# Patient Record
Sex: Male | Born: 1985 | Race: White | Hispanic: No | Smoking: Current every day smoker
Health system: Southern US, Community
[De-identification: ages and names within clinical notes are randomized; demographics above are authoritative.]

## PROBLEM LIST (undated history)

## (undated) DIAGNOSIS — M8700 Idiopathic aseptic necrosis of unspecified bone: Secondary | ICD-10-CM

## (undated) DIAGNOSIS — G4730 Sleep apnea, unspecified: Secondary | ICD-10-CM

## (undated) DIAGNOSIS — F909 Attention-deficit hyperactivity disorder, unspecified type: Secondary | ICD-10-CM

## (undated) DIAGNOSIS — J189 Pneumonia, unspecified organism: Secondary | ICD-10-CM

## (undated) DIAGNOSIS — Z8711 Personal history of peptic ulcer disease: Secondary | ICD-10-CM

## (undated) DIAGNOSIS — K219 Gastro-esophageal reflux disease without esophagitis: Secondary | ICD-10-CM

## (undated) HISTORY — PX: MOUTH SURGERY: SHX715

## (undated) HISTORY — PX: MENISCECTOMY: SHX123

---

## 2020-01-13 DIAGNOSIS — M8700 Idiopathic aseptic necrosis of unspecified bone: Secondary | ICD-10-CM

## 2020-01-23 NOTE — H&P (Addendum)
HIP ARTHROPLASTY ADMISSION H&P  Patient ID: [name] MRN: [id number] DOB/AGE: [birth year] 33 y.o.  Chief Complaint: left hip pain.  Planned Procedure Date: 02/17/2020 Medical Clearance by Mendel Ryder PA-C   HPI: Wayne Medina is a 34 y.o. 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 The patient has a history of pain and functional disability in the left hip and has failed non-surgical conservative treatments for greater than 12 weeks to include NSAID's and/or analgesics, corticosteriod injections, flexibility and strengthening excercises and activity modification.  Onset of symptoms was abrupt, starting 4 months ago with rapidlly worsening course since that time. The patient noted prior procedures on the knee to include  menisectomy on the left knee.  Patient currently rates pain at 7 out of 10 with activity. Patient has worsening of pain with activity and weight bearing, pain that interferes with activities of daily living, pain with passive range of motion, crepitus and joint swelling.  Patient has evidence of degeneration of the b/l hips by imaging studies.  There is no active infection.  PMH OSA- CPAP Noncompliant  Prior to Admission medications   Not on File   Social History   Socioeconomic History  . Marital status: Single    Spouse name: Not on file  . Number of children: Not on file  . Years of education: Not on file  . Highest education level: Not on file  Occupational History  . Not on file  Tobacco Use  . Smoking status: Not on file  Substance and Sexual Activity  . Alcohol use: Not on file  . Drug use: Not on file  . Sexual activity: Not on file  Other Topics Concern  . Not on file  Social History Narrative  . Not on file   Social Determinants of Health   Financial Resource Strain:   . Difficulty of Paying Living Expenses:   Food Insecurity:   . Worried About Programme researcher, broadcasting/film/video in the Last Year:   . Barista in the Last Year:    Transportation Needs:   . Freight forwarder (Medical):   Marland Kitchen Lack of Transportation (Non-Medical):   Physical Activity:   . Days of Exercise per Week:   . Minutes of Exercise per Session:   Stress:   . Feeling of Stress :   Social Connections:   . Frequency of Communication with Friends and Family:   . Frequency of Social Gatherings with Friends and Family:   . Attends Religious Services:   . Active Member of Clubs or Organizations:   . Attends Banker Meetings:   Marland Kitchen Marital Status:    No family history on file.  ROS: Currently denies lightheadedness, dizziness, Fever, chills, CP, SOB.   No personal history of DVT, PE, MI, or CVA. No loose teeth or dentures All other systems have been reviewed and were otherwise currently negative with the exception of those mentioned in the HPI and as above.  Objective: Vitals: Ht: 71"  Wt: 133.4 lbs BMI: 18.5 Temp: 97.9  BP: 123/79  Pulse: 79 O2 100% on room air.   Physical Exam: General: Alert, NAD. Trendelenberg Gait   HEENT: EOMI, Good Neck Extension, AT, [place] Pulm: No increased work of breathing.  Clear B/L A/P w/o crackle or wheeze.  CV: RRR, No m/g/r appreciated  GI: soft, NT, ND Neuro: Neuro without gross focal deficit.  Sensation intact distally Skin: No lesions in the area of chief complaint MSK/Surgical  Site: Left Hip pain with passive ROM.  Positive Stinchfield.  5/5 strength.  NVI.    Imaging Review Plain radiographs demonstrate moderate degenerative joint disease of the bilateral hip.   The bone quality appears to be poor for age and reported activity level.  Preoperative templating of the joint replacement has been completed, documented, and submitted to the Operating Room personnel in order to optimize intra-operative equipment management.  Assessment: AVN LEFT HIP Active Problems:   AVN of femur (HCC)   Plan: Plan for Procedure(s): TOTAL HIP ARTHROPLASTY ANTERIOR APPROACH  The patient history,  physical exam, clinical judgement of the provider and imaging are consistent with end stage degenerative joint disease and total joint arthroplasty is deemed medically necessary. The treatment options including medical management, injection therapy, and arthroplasty were discussed at length. The risks and benefits of Procedure(s): TOTAL HIP ARTHROPLASTY ANTERIOR APPROACH were presented and reviewed.  The risks of nonoperative treatment, versus surgical intervention including but not limited to continued pain, aseptic loosening, stiffness, dislocation/subluxation, infection, bleeding, nerve injury, blood clots, cardiopulmonary complications, morbidity, mortality, among others were discussed. The patient verbalizes understanding and wishes to proceed with the plan.  Patient is being admitted for surgery, pain control, PT, prophylactic antibiotics, VTE prophylaxis, progressive ambulation, ADL's and discharge planning.   Dental prophylaxis discussed and recommended for 2 years postoperatively.   The patient does  meet the criteria for TXA which will be used perioperatively.    ASA 81 mg BID will be used postoperatively for DVT prophylaxis in addition to SCDs, and early ambulation.  Plan for Norco, Celebrex, for pain.  Robaxin for muscle spasms, Zofran for nausea.  The patient is planning to be discharged home with OPPT.   Patient's anticipated LOS is less than 2 midnights, meeting these requirements: - Younger than 65 - Lives within 1 hour of care - Has a competent adult at home to recover with post-op recover - NO history of  - Chronic pain requiring opiods  - Diabetes  - Coronary Artery Disease  - Heart failure  - Heart attack  - Stroke  - DVT/VTE  - Cardiac arrhythmia  - Respiratory Failure/COPD  - Renal failure  - Anemia  - Advanced Liver disease       Rainey Pines, PA-C 01/23/2020 9:38 AM

## 2020-02-06 NOTE — Patient Instructions (Addendum)
DUE TO COVID-19 ONLY ONE VISITOR IS ALLOWED TO COME WITH YOU AND STAY IN THE WAITING ROOM ONLY DURING PRE OP AND PROCEDURE.    COVID SWAB TESTING MUST BE COMPLETED ON:  Friday, Aug. 27, 2021 at 1:00 PM    [address] [place], [place] [address]  (Must self quarantine after testing. Follow instructions on handout.)       Your procedure is scheduled on: Tuesday, Aug. 31, 2021   Report to [facility] Main  Entrance   Report to Short Stay at 5:30 AM   [facility])   Call this number if you have problems the morning of surgery [phone]   Do not eat food :After Midnight.   May have liquids until 4:30 AM   day of surgery  CLEAR LIQUID DIET  Foods Allowed                                                                     Foods Excluded  Water, Black Coffee and tea, regular and decaf                             liquids that you cannot  Plain Jell-O in any flavor  (No red)                                           see through such as: Fruit ices (not with fruit pulp)                                     milk, soups, orange juice              Iced Popsicles (No red)                                    All solid food                                   Apple juices Sports drinks like Gatorade (No red) Lightly seasoned clear broth or consume(fat free) Sugar, honey syrup    Complete one Ensure drink the morning of surgery at  4:30 AM   the day of surgery.     Take these medicines the morning of surgery with A SIP OF WATER: None  Oral Hygiene is also important to reduce your risk of infection.                                     Remember - BRUSH YOUR TEETH THE MORNING OF SURGERY WITH YOUR REGULAR TOOTHPASTE   Do NOT smoke after Midnight                                You may  not have any metal on your body including jewelry, and body piercings              Do not wear lotions, powders, perfumes/cologne, or deodorant                           Men may shave face and  neck.   Do not bring valuables to the hospital. [place] [facility] IS NOT             RESPONSIBLE   FOR VALUABLES.   Contacts, dentures or bridgework may not be worn into surgery.    Patients discharged the day of surgery will not be allowed to drive home.   Special Instructions: Bring a copy of your healthcare power of attorney and living will documents  the day of surgery if you haven't scanned them in before.              Please read over the following fact sheets you were given: IF YOU HAVE QUESTIONS ABOUT YOUR PRE OP INSTRUCTIONS PLEASE CALL [phone]   [facility] - Preparing for Surgery Before surgery, you can play an important role.  Because skin is not sterile, your skin needs to be as free of germs as possible.  You can reduce the number of germs on your skin by washing with CHG (chlorahexidine gluconate) soap before surgery.  CHG is an antiseptic cleaner which kills germs and bonds with the skin to continue killing germs even after washing. Please DO NOT use if you have an allergy to CHG or antibacterial soaps.  If your skin becomes reddened/irritated stop using the CHG and inform your nurse when you arrive at Short Stay. Do not shave (including legs and underarms) for at least 48 hours prior to the first CHG shower.  You may shave your face/neck.  Please follow these instructions carefully:  1.  Shower with CHG Soap the night before surgery and the  morning of surgery.  2.  If you choose to wash your hair, wash your hair first as usual with your normal  shampoo.  3.  After you shampoo, rinse your hair and body thoroughly to remove the shampoo.                             4.  Use CHG as you would any other liquid soap.  You can apply chg directly to the skin and wash.  Gently with a scrungie or clean washcloth.  5.  Apply the CHG Soap to your body ONLY FROM THE NECK DOWN.   Do   not use on face/ open                           Wound or open sores. Avoid contact with eyes, ears mouth  and   genitals (private parts).                       Wash face,  Genitals (private parts) with your normal soap.             6.  Wash thoroughly, paying special attention to the area where your    surgery  will be performed.  7.  Thoroughly rinse your body with warm water from the neck down.  8.  DO NOT shower/wash with your normal soap after using and rinsing off the  CHG Soap.                9.  Pat yourself dry with a clean towel.            10.  Wear clean pajamas.            11.  Place clean sheets on your bed the night of your first shower and do not  sleep with pets. Day of Surgery : Do not apply any lotions/deodorants the morning of surgery.  Please wear clean clothes to the hospital/surgery center.  FAILURE TO FOLLOW THESE INSTRUCTIONS MAY RESULT IN THE CANCELLATION OF YOUR SURGERY  PATIENT SIGNATURE_________________________________  NURSE SIGNATURE__________________________________  ________________________________________________________________________   Wayne Medina  An incentive spirometer is a tool that can help keep your lungs clear and active. This tool measures how well you are filling your lungs with each breath. Taking long deep breaths may help reverse or decrease the chance of developing breathing (pulmonary) problems (especially infection) following:  A long period of time when you are unable to move or be active. BEFORE THE PROCEDURE   If the spirometer includes an indicator to show your best effort, your nurse or respiratory therapist will set it to a desired goal.  If possible, sit up straight or lean slightly forward. Try not to slouch.  Hold the incentive spirometer in an upright position. INSTRUCTIONS FOR USE  1. Sit on the edge of your bed if possible, or sit up as far as you can in bed or on a chair. 2. Hold the incentive spirometer in an upright position. 3. Breathe out normally. 4. Place the mouthpiece in your mouth and seal your lips  tightly around it. 5. Breathe in slowly and as deeply as possible, raising the piston or the ball toward the top of the column. 6. Hold your breath for 3-5 seconds or for as long as possible. Allow the piston or ball to fall to the bottom of the column. 7. Remove the mouthpiece from your mouth and breathe out normally. 8. Rest for a few seconds and repeat Steps 1 through 7 at least 10 times every 1-2 hours when you are awake. Take your time and take a few normal breaths between deep breaths. 9. The spirometer may include an indicator to show your best effort. Use the indicator as a goal to work toward during each repetition. 10. After each set of 10 deep breaths, practice coughing to be sure your lungs are clear. If you have an incision (the cut made at the time of surgery), support your incision when coughing by placing a pillow or rolled up towels firmly against it. Once you are able to get out of bed, walk around indoors and cough well. You may stop using the incentive spirometer when instructed by your caregiver.  RISKS AND COMPLICATIONS  Take your time so you do not get dizzy or light-headed.  If you are in pain, you may need to take or ask for pain medication before doing incentive spirometry. It is harder to take a deep breath if you are having pain. AFTER USE  Rest and breathe slowly and easily.  It can be helpful to keep track of a log of your progress. Your caregiver can provide you with a simple table to help with this. If you are using the spirometer at home, follow these instructions: SEEK MEDICAL CARE IF:   You are having difficultly using the spirometer.  You have trouble using the spirometer as often  as instructed.  Your pain medication is not giving enough relief while using the spirometer.  You develop fever of 100.5 F (38.1 C) or higher. SEEK IMMEDIATE MEDICAL CARE IF:   You cough up bloody sputum that had not been present before.  You develop fever of 102 F  (38.9 C) or greater.  You develop worsening pain at or near the incision site. MAKE SURE YOU:   Understand these instructions.  Will watch your condition.  Will get help right away if you are not doing well or get worse. Document Released: 10/16/2006 Document Revised: 08/28/2011 Document Reviewed: 12/17/2006 Exit[employer] Patient Information 2014 Exit[employer], [place].   ________________________________________________________________________  WHAT IS A BLOOD TRANSFUSION? Blood Transfusion Information  A transfusion is the replacement of blood or some of its parts. Blood is made up of multiple cells which provide different functions.  Red blood cells carry oxygen and are used for blood loss replacement.  White blood cells fight against infection.  Platelets control bleeding.  Plasma helps clot blood.  Other blood products are available for specialized needs, such as hemophilia or other clotting disorders. BEFORE THE TRANSFUSION  Who gives blood for transfusions?   Healthy volunteers who are fully evaluated to make sure their blood is safe. This is blood bank blood. Transfusion therapy is the safest it has ever been in the practice of medicine. Before blood is taken from a donor, a complete history is taken to make sure that person has no history of diseases nor engages in risky social behavior (examples are intravenous drug use or sexual activity with multiple partners). The donor's travel history is screened to minimize risk of transmitting infections, such as malaria. The donated blood is tested for signs of infectious diseases, such as HIV and hepatitis. The blood is then tested to be sure it is compatible with you in order to minimize the chance of a transfusion reaction. If you or a relative donates blood, this is often done in anticipation of surgery and is not appropriate for emergency situations. It takes many days to process the donated blood. RISKS AND COMPLICATIONS Although  transfusion therapy is very safe and saves many lives, the main dangers of transfusion include:   Getting an infectious disease.  Developing a transfusion reaction. This is an allergic reaction to something in the blood you were given. Every precaution is taken to prevent this. The decision to have a blood transfusion has been considered carefully by your caregiver before blood is given. Blood is not given unless the benefits outweigh the risks. AFTER THE TRANSFUSION  Right after receiving a blood transfusion, you will usually feel much better and more energetic. This is especially true if your red blood cells have gotten low (anemic). The transfusion raises the level of the red blood cells which carry oxygen, and this usually causes an energy increase.  The nurse administering the transfusion will monitor you carefully for complications. HOME CARE INSTRUCTIONS  No special instructions are needed after a transfusion. You may find your energy is better. Speak with your caregiver about any limitations on activity for underlying diseases you may have. SEEK MEDICAL CARE IF:   Your condition is not improving after your transfusion.  You develop redness or irritation at the intravenous (IV) site. SEEK IMMEDIATE MEDICAL CARE IF:  Any of the following symptoms occur over the next 12 hours:  Shaking chills.  You have a temperature by mouth above 102 F (38.9 C), not controlled by medicine.  Chest, back, or muscle  pain.  People around you feel you are not acting correctly or are confused.  Shortness of breath or difficulty breathing.  Dizziness and fainting.  You get a rash or develop hives.  You have a decrease in urine output.  Your urine turns a dark color or changes to pink, red, or brown. Any of the following symptoms occur over the next 10 days:  You have a temperature by mouth above 102 F (38.9 C), not controlled by medicine.  Shortness of breath.  Weakness after normal  activity.  The white part of the eye turns yellow (jaundice).  You have a decrease in the amount of urine or are urinating less often.  Your urine turns a dark color or changes to pink, red, or brown. Document Released: 06/02/2000 Document Revised: 08/28/2011 Document Reviewed: 01/20/2008 [facility] Patient Information 2014 [place], [place].  _______________________________________________________________________

## 2020-02-06 NOTE — Progress Notes (Signed)
COVID Vaccine Completed: Date COVID Vaccine completed: COVID vaccine manufacturer: Pfizer    Moderna   [employer]   PCP -  Cardiologist -   Chest x-ray -  EKG -  Stress Test -  ECHO -  Cardiac Cath -   Sleep Study -  CPAP -   Fasting Blood Sugar -  Checks Blood Sugar _____ times a day  Blood Thinner Instructions: Aspirin Instructions: Last Dose:  Anesthesia review:   Patient denies shortness of breath, fever, cough and chest pain at PAT appointment   Patient verbalized understanding of instructions that were given to them at the PAT appointment. Patient was also instructed that they will need to review over the PAT instructions again at home before surgery. 

## 2020-02-09 ENCOUNTER — Encounter (HOSPITAL_COMMUNITY): Payer: Self-pay

## 2020-02-09 ENCOUNTER — Other Ambulatory Visit: Payer: Self-pay

## 2020-02-09 ENCOUNTER — Encounter (INDEPENDENT_AMBULATORY_CARE_PROVIDER_SITE_OTHER): Payer: Self-pay

## 2020-02-09 ENCOUNTER — Encounter (HOSPITAL_COMMUNITY)
Admission: RE | Admit: 2020-02-09 | Discharge: 2020-02-09 | Disposition: A | Discharge status: Home / Self Care | Payer: 59 | Source: Ambulatory Visit | Attending: Orthopedic Surgery | Admitting: Orthopedic Surgery

## 2020-02-09 DIAGNOSIS — Z01812 Encounter for preprocedural laboratory examination: Secondary | ICD-10-CM | POA: Diagnosis present

## 2020-02-09 LAB — CBC
HCT: 41 % (ref 39.0–52.0)
Hemoglobin: 13.7 g/dL (ref 13.0–17.0)
MCH: 31.8 pg (ref 26.0–34.0)
MCHC: 33.4 g/dL (ref 30.0–36.0)
MCV: 95.1 fL (ref 80.0–100.0)
Platelets: 339 10*3/uL (ref 150–400)
RBC: 4.31 MIL/uL (ref 4.22–5.81)
RDW: 13 % (ref 11.5–15.5)
WBC: 9.3 10*3/uL (ref 4.0–10.5)
nRBC: 0 % (ref 0.0–0.2)

## 2020-02-09 LAB — URINALYSIS, ROUTINE W REFLEX MICROSCOPIC
Bilirubin Urine: NEGATIVE
Glucose, UA: NEGATIVE mg/dL
Hgb urine dipstick: NEGATIVE
Ketones, ur: NEGATIVE mg/dL
Leukocytes,Ua: NEGATIVE
Nitrite: NEGATIVE
Protein, ur: NEGATIVE mg/dL
Specific Gravity, Urine: 1.014 (ref 1.005–1.030)
pH: 6 (ref 5.0–8.0)

## 2020-02-09 LAB — BASIC METABOLIC PANEL
Anion gap: 10 (ref 5–15)
BUN: 13 mg/dL (ref 6–20)
CO2: 27 mmol/L (ref 22–32)
Calcium: 10 mg/dL (ref 8.9–10.3)
Chloride: 103 mmol/L (ref 98–111)
Creatinine, Ser: 0.69 mg/dL (ref 0.61–1.24)
GFR calc Af Amer: >60 mL/min (ref >60)
GFR calc non Af Amer: >60 mL/min (ref >60)
Glucose, Bld: 114 mg/dL — ABNORMAL HIGH (ref 70–99)
Potassium: 5.1 mmol/L (ref 3.5–5.1)
Sodium: 140 mmol/L (ref 135–145)

## 2020-02-09 LAB — PROTIME-INR
INR: 0.9 (ref 0.8–1.2)
Prothrombin Time: 11.9 seconds (ref 11.4–15.2)

## 2020-02-09 LAB — SURGICAL PCR SCREEN
MRSA, PCR: NEGATIVE
Staphylococcus aureus: NEGATIVE

## 2020-02-09 NOTE — Progress Notes (Signed)
COVID Vaccine Completed: Date COVID Vaccine completed: COVID vaccine manufacturer: Pfizer    Moderna   [employer]   PCP -  Floyde Parkins, MD Cardiologist -   Chest x-ray -  EKG -  Stress Test -  ECHO -  Cardiac Cath -   Sleep Study -  CPAP -   Fasting Blood Sugar -  Checks Blood Sugar _____ times a day  Blood Thinner Instructions: Aspirin Instructions: Last Dose:  Anesthesia review: ADL w/o SOB  Patient denies shortness of breath, fever, cough and chest pain at PAT appointment   Patient verbalized understanding of instructions that were given to them at the PAT appointment. Patient was also instructed that they will need to review over the PAT instructions again at home before surgery.

## 2020-02-13 ENCOUNTER — Other Ambulatory Visit (HOSPITAL_COMMUNITY)
Admission: RE | Admit: 2020-02-13 | Discharge: 2020-02-13 | Disposition: A | Discharge status: Home / Self Care | Payer: 59 | Source: Ambulatory Visit | Attending: Orthopedic Surgery | Admitting: Orthopedic Surgery

## 2020-02-13 DIAGNOSIS — Z01812 Encounter for preprocedural laboratory examination: Secondary | ICD-10-CM | POA: Insufficient documentation

## 2020-02-13 DIAGNOSIS — Z20822 Contact with and (suspected) exposure to covid-19: Secondary | ICD-10-CM | POA: Diagnosis not present

## 2020-02-13 LAB — SARS CORONAVIRUS 2 (TAT 6-24 HRS): SARS Coronavirus 2: NEGATIVE

## 2020-02-16 NOTE — Anesthesia Preprocedure Evaluation (Addendum)
Anesthesia Evaluation  Patient identified by MRN, date of birth, ID band Patient awake    Reviewed: Allergy & Precautions, NPO status , Patient's Chart, lab work & pertinent test results  Airway Mallampati: II  TM Distance: >3 FB Neck ROM: Full    Dental no notable dental hx. (+) Teeth Intact, Dental Advisory Given   Pulmonary Current Smoker and Patient abstained from smoking.,  Still smoking, 2-4 cigg/d No inhalers    Pulmonary exam normal breath sounds clear to auscultation       Cardiovascular negative cardio ROS Normal cardiovascular exam Rhythm:Regular Rate:Normal     Neuro/Psych negative neurological ROS  negative psych ROS   GI/Hepatic negative GI ROS, Neg liver ROS,   Endo/Other  negative endocrine ROS  Renal/GU negative Renal ROS  negative genitourinary   Musculoskeletal L hip OA, AVN of femur   Abdominal Normal abdominal exam  (+)   Peds  Hematology negative hematology ROS (+) hct 41, plt 339   Anesthesia Other Findings   Reproductive/Obstetrics negative OB ROS                            Anesthesia Physical Anesthesia Plan  ASA: II  Anesthesia Plan: Spinal and MAC   Post-op Pain Management:    Induction:   PONV Risk Score and Plan: 2 and Propofol infusion and TIVA  Airway Management Planned: Natural Airway and Nasal Cannula  Additional Equipment: None  Intra-op Plan:   Post-operative Plan:   Informed Consent: I have reviewed the patients History and Physical, chart, labs and discussed the procedure including the risks, benefits and alternatives for the proposed anesthesia with the patient or authorized representative who has indicated his/her understanding and acceptance.       Plan Discussed with: CRNA  Anesthesia Plan Comments:        Anesthesia Quick Evaluation

## 2020-02-17 ENCOUNTER — Ambulatory Visit (HOSPITAL_COMMUNITY): Payer: 59 | Admitting: Certified Registered Nurse Anesthetist

## 2020-02-17 ENCOUNTER — Ambulatory Visit (HOSPITAL_COMMUNITY): Payer: 59

## 2020-02-17 ENCOUNTER — Encounter (HOSPITAL_COMMUNITY): Payer: Self-pay | Admitting: Orthopedic Surgery

## 2020-02-17 ENCOUNTER — Ambulatory Visit (HOSPITAL_COMMUNITY)
Admission: RE | Admit: 2020-02-17 | Discharge: 2020-02-17 | Disposition: A | Discharge status: Home / Self Care | Payer: 59 | Source: Ambulatory Visit | Attending: Orthopedic Surgery | Admitting: Orthopedic Surgery

## 2020-02-17 ENCOUNTER — Other Ambulatory Visit: Payer: Self-pay

## 2020-02-17 ENCOUNTER — Encounter (HOSPITAL_COMMUNITY)
Admission: RE | Disposition: A | Discharge status: Home / Self Care | Payer: Self-pay | Source: Ambulatory Visit | Attending: Orthopedic Surgery

## 2020-02-17 DIAGNOSIS — M8700 Idiopathic aseptic necrosis of unspecified bone: Secondary | ICD-10-CM

## 2020-02-17 DIAGNOSIS — M879 Osteonecrosis, unspecified: Secondary | ICD-10-CM | POA: Diagnosis present

## 2020-02-17 DIAGNOSIS — F17210 Nicotine dependence, cigarettes, uncomplicated: Secondary | ICD-10-CM | POA: Diagnosis not present

## 2020-02-17 DIAGNOSIS — G4733 Obstructive sleep apnea (adult) (pediatric): Secondary | ICD-10-CM | POA: Diagnosis not present

## 2020-02-17 DIAGNOSIS — Z419 Encounter for procedure for purposes other than remedying health state, unspecified: Secondary | ICD-10-CM

## 2020-02-17 HISTORY — PX: TOTAL HIP ARTHROPLASTY: SHX124

## 2020-02-17 LAB — TYPE AND SCREEN
ABO/RH(D): A POS
Antibody Screen: NEGATIVE

## 2020-02-17 LAB — ABO/RH: ABO/RH(D): A POS

## 2020-02-17 SURGERY — ARTHROPLASTY, HIP, TOTAL, ANTERIOR APPROACH
Anesthesia: Monitor Anesthesia Care | Site: Hip | Laterality: Left

## 2020-02-17 MED ORDER — PROPOFOL 10 MG/ML IV BOLUS
INTRAVENOUS | Status: DC | PRN
  Administered 2020-02-17 (×3): 20 mg via INTRAVENOUS

## 2020-02-17 MED ORDER — FENTANYL CITRATE (PF) 100 MCG/2ML IJ SOLN
INTRAMUSCULAR | Status: AC
  Filled 2020-02-17: qty 2

## 2020-02-17 MED ORDER — TRANEXAMIC ACID-NACL 1000-0.7 MG/100ML-% IV SOLN
1000.0000 mg | INTRAVENOUS | Status: AC
  Administered 2020-02-17: 1000 mg via INTRAVENOUS
  Filled 2020-02-17: qty 100

## 2020-02-17 MED ORDER — FENTANYL CITRATE (PF) 100 MCG/2ML IJ SOLN
INTRAMUSCULAR | Status: DC | PRN
Start: 2020-02-17 — End: 2020-02-17
  Administered 2020-02-17: 100 ug via INTRAVENOUS

## 2020-02-17 MED ORDER — DEXAMETHASONE SODIUM PHOSPHATE 10 MG/ML IJ SOLN
INTRAMUSCULAR | Status: DC | PRN
  Administered 2020-02-17: 10 mg via INTRAVENOUS

## 2020-02-17 MED ORDER — DEXMEDETOMIDINE (PRECEDEX) IN NS 20 MCG/5ML (4 MCG/ML) IV SYRINGE
PREFILLED_SYRINGE | INTRAVENOUS | Status: DC | PRN
  Administered 2020-02-17 (×2): 8 ug via INTRAVENOUS

## 2020-02-17 MED ORDER — OXYCODONE HCL 5 MG PO TABS
5.0000 mg | ORAL_TABLET | Freq: Once | ORAL | Status: AC | PRN
  Administered 2020-02-17: 5 mg via ORAL

## 2020-02-17 MED ORDER — PROPOFOL 500 MG/50ML IV EMUL
INTRAVENOUS | Status: DC | PRN
  Administered 2020-02-17: 20 ug/kg/min via INTRAVENOUS

## 2020-02-17 MED ORDER — PROPOFOL 500 MG/50ML IV EMUL
INTRAVENOUS | Status: AC
  Filled 2020-02-17: qty 50

## 2020-02-17 MED ORDER — BUPIVACAINE LIPOSOME 1.3 % IJ SUSP
10.0000 mL | Freq: Once | INTRAMUSCULAR | Status: AC
  Administered 2020-02-17: 10 mL
  Filled 2020-02-17: qty 10

## 2020-02-17 MED ORDER — METHOCARBAMOL 500 MG PO TABS
500.0000 mg | ORAL_TABLET | Freq: Three times a day (TID) | ORAL | Status: DC
  Administered 2020-02-17: 500 mg via ORAL

## 2020-02-17 MED ORDER — CHLORHEXIDINE GLUCONATE 0.12 % MT SOLN
15.0000 mL | Freq: Once | OROMUCOSAL | Status: AC
  Administered 2020-02-17: 15 mL via OROMUCOSAL

## 2020-02-17 MED ORDER — MIDAZOLAM HCL 5 MG/5ML IJ SOLN
INTRAMUSCULAR | Status: DC | PRN
  Administered 2020-02-17: 2 mg via INTRAVENOUS

## 2020-02-17 MED ORDER — OXYCODONE HCL 5 MG PO TABS
5.0000 mg | ORAL_TABLET | Freq: Once | ORAL | Status: AC
  Administered 2020-02-17: 5 mg via ORAL

## 2020-02-17 MED ORDER — LIDOCAINE 2% (20 MG/ML) 5 ML SYRINGE
INTRAMUSCULAR | Status: DC | PRN
  Administered 2020-02-17: 60 mg via INTRAVENOUS

## 2020-02-17 MED ORDER — OXYCODONE HCL 5 MG PO TABS
ORAL_TABLET | ORAL | Status: AC
  Filled 2020-02-17: qty 1

## 2020-02-17 MED ORDER — ACETAMINOPHEN 500 MG PO TABS
1000.0000 mg | ORAL_TABLET | Freq: Once | ORAL | Status: AC
  Administered 2020-02-17: 1000 mg via ORAL
  Filled 2020-02-17: qty 2

## 2020-02-17 MED ORDER — ACETAMINOPHEN 500 MG PO TABS: 1000.0000 mg | ORAL_TABLET | Freq: Once | ORAL | Status: DC

## 2020-02-17 MED ORDER — LACTATED RINGERS IV BOLUS
500.0000 mL | Freq: Once | INTRAVENOUS | Status: AC
  Administered 2020-02-17: 500 mL via INTRAVENOUS

## 2020-02-17 MED ORDER — ASPIRIN EC 81 MG PO TBEC: 81.0000 mg | DELAYED_RELEASE_TABLET | Freq: Every day | ORAL | 0 refills | Status: AC

## 2020-02-17 MED ORDER — HYDROMORPHONE HCL 1 MG/ML IJ SOLN: 0.2500 mg | INTRAMUSCULAR | Status: DC | PRN

## 2020-02-17 MED ORDER — SODIUM CHLORIDE FLUSH 0.9 % IV SOLN
INTRAVENOUS | Status: DC | PRN
  Administered 2020-02-17: 20 mL

## 2020-02-17 MED ORDER — OXYCODONE HCL 5 MG/5ML PO SOLN: 5.0000 mg | Freq: Once | ORAL | Status: AC | PRN

## 2020-02-17 MED ORDER — CELECOXIB 200 MG PO CAPS
400.0000 mg | ORAL_CAPSULE | Freq: Once | ORAL | Status: AC
  Administered 2020-02-17: 400 mg via ORAL
  Filled 2020-02-17: qty 2

## 2020-02-17 MED ORDER — MIDAZOLAM HCL 2 MG/2ML IJ SOLN
INTRAMUSCULAR | Status: AC
  Filled 2020-02-17: qty 2

## 2020-02-17 MED ORDER — ONDANSETRON HCL 4 MG/2ML IJ SOLN
INTRAMUSCULAR | Status: DC | PRN
  Administered 2020-02-17: 4 mg via INTRAVENOUS

## 2020-02-17 MED ORDER — 0.9 % SODIUM CHLORIDE (POUR BTL) OPTIME
TOPICAL | Status: DC | PRN
  Administered 2020-02-17: 1000 mL

## 2020-02-17 MED ORDER — KETOROLAC TROMETHAMINE 30 MG/ML IJ SOLN
30.0000 mg | Freq: Once | INTRAMUSCULAR | Status: AC | PRN
  Administered 2020-02-17: 30 mg via INTRAVENOUS

## 2020-02-17 MED ORDER — PROMETHAZINE HCL 25 MG/ML IJ SOLN: 6.2500 mg | INTRAMUSCULAR | Status: DC | PRN

## 2020-02-17 MED ORDER — MEPERIDINE HCL 50 MG/ML IJ SOLN: 6.2500 mg | INTRAMUSCULAR | Status: DC | PRN

## 2020-02-17 MED ORDER — WATER FOR IRRIGATION, STERILE IR SOLN
Status: DC | PRN
  Administered 2020-02-17: 2000 mL

## 2020-02-17 MED ORDER — PROPOFOL 1000 MG/100ML IV EMUL
INTRAVENOUS | Status: AC
  Filled 2020-02-17: qty 100

## 2020-02-17 MED ORDER — LACTATED RINGERS IV BOLUS: 250.0000 mL | Freq: Once | INTRAVENOUS | Status: DC

## 2020-02-17 MED ORDER — METHOCARBAMOL 500 MG PO TABS
ORAL_TABLET | ORAL | Status: AC
  Filled 2020-02-17: qty 1

## 2020-02-17 MED ORDER — SODIUM CHLORIDE (PF) 0.9 % IJ SOLN
INTRAMUSCULAR | Status: AC
  Filled 2020-02-17: qty 20

## 2020-02-17 MED ORDER — OXYCODONE-ACETAMINOPHEN 5-325 MG PO TABS: 1.0000 | ORAL_TABLET | ORAL | 0 refills | Status: AC | PRN

## 2020-02-17 MED ORDER — DEXMEDETOMIDINE (PRECEDEX) IN NS 20 MCG/5ML (4 MCG/ML) IV SYRINGE
PREFILLED_SYRINGE | INTRAVENOUS | Status: AC
  Filled 2020-02-17: qty 5

## 2020-02-17 MED ORDER — CEFAZOLIN SODIUM-DEXTROSE 2-4 GM/100ML-% IV SOLN
2.0000 g | INTRAVENOUS | Status: AC
  Administered 2020-02-17: 2 g via INTRAVENOUS
  Filled 2020-02-17: qty 100

## 2020-02-17 MED ORDER — LACTATED RINGERS IV SOLN: INTRAVENOUS | Status: DC

## 2020-02-17 MED ORDER — DEXAMETHASONE SODIUM PHOSPHATE 10 MG/ML IJ SOLN
INTRAMUSCULAR | Status: AC
  Filled 2020-02-17: qty 1

## 2020-02-17 MED ORDER — METHOCARBAMOL 750 MG PO TABS: 750.0000 mg | ORAL_TABLET | Freq: Four times a day (QID) | ORAL | 0 refills | Status: AC

## 2020-02-17 MED ORDER — CELECOXIB 200 MG PO CAPS: 200.0000 mg | ORAL_CAPSULE | Freq: Two times a day (BID) | ORAL | 2 refills | Status: AC

## 2020-02-17 MED ORDER — ONDANSETRON HCL 4 MG/2ML IJ SOLN
INTRAMUSCULAR | Status: AC
  Filled 2020-02-17: qty 2

## 2020-02-17 MED ORDER — KETOROLAC TROMETHAMINE 30 MG/ML IJ SOLN
INTRAMUSCULAR | Status: AC
  Filled 2020-02-17: qty 1

## 2020-02-17 MED ORDER — ORAL CARE MOUTH RINSE: 15.0000 mL | Freq: Once | OROMUCOSAL | Status: AC

## 2020-02-17 MED ORDER — OXYCODONE-ACETAMINOPHEN 5-325 MG PO TABS
1.0000 | ORAL_TABLET | Freq: Once | ORAL | Status: AC
  Administered 2020-02-17: 1 via ORAL

## 2020-02-17 MED ORDER — BUPIVACAINE HCL (PF) 0.75 % IJ SOLN
INTRAMUSCULAR | Status: DC | PRN
  Administered 2020-02-17: 1.8 mL via INTRATHECAL

## 2020-02-17 MED ORDER — OXYCODONE-ACETAMINOPHEN 5-325 MG PO TABS
ORAL_TABLET | ORAL | Status: AC
  Filled 2020-02-17: qty 1

## 2020-02-17 SURGICAL SUPPLY — 47 items
BAG ZIPLOCK 12X15 (MISCELLANEOUS) IMPLANT
BLADE SAG 18X100X1.27 (BLADE) ×3 IMPLANT
BLADE SURG SZ10 CARB STEEL (BLADE) ×6 IMPLANT
CHLORAPREP W/TINT 26 (MISCELLANEOUS) ×3 IMPLANT
CLOSURE STERI-STRIP 1/2X4 (GAUZE/BANDAGES/DRESSINGS) ×1
CLSR STERI-STRIP ANTIMIC 1/2X4 (GAUZE/BANDAGES/DRESSINGS) ×2 IMPLANT
COVER PERINEAL POST (MISCELLANEOUS) ×3 IMPLANT
COVER SURGICAL LIGHT HANDLE (MISCELLANEOUS) ×3 IMPLANT
COVER WAND RF STERILE (DRAPES) IMPLANT
DECANTER SPIKE VIAL GLASS SM (MISCELLANEOUS) ×6 IMPLANT
DRAPE IMP U-DRAPE 54X76 (DRAPES) ×3 IMPLANT
DRAPE STERI IOBAN 125X83 (DRAPES) ×3 IMPLANT
DRAPE U-SHAPE 47X51 STRL (DRAPES) ×6 IMPLANT
DRSG MEPILEX BORDER 4X8 (GAUZE/BANDAGES/DRESSINGS) ×3 IMPLANT
ELECT BLADE TIP CTD 4 INCH (ELECTRODE) IMPLANT
ELECT REM PT RETURN 15FT ADLT (MISCELLANEOUS) ×3 IMPLANT
GLOVE BIO SURGEON STRL SZ7.5 (GLOVE) ×6 IMPLANT
GLOVE BIOGEL PI IND STRL 7.5 (GLOVE) ×1 IMPLANT
GLOVE BIOGEL PI IND STRL 8 (GLOVE) ×1 IMPLANT
GLOVE BIOGEL PI INDICATOR 7.5 (GLOVE) ×2
GLOVE BIOGEL PI INDICATOR 8 (GLOVE) ×2
GOWN STRL REUS W/TWL LRG LVL3 (GOWN DISPOSABLE) ×3 IMPLANT
GOWN STRL REUS W/TWL XL LVL3 (GOWN DISPOSABLE) ×3 IMPLANT
HEAD BIOLOX HIP 36/-2.5 (Joint) ×1 IMPLANT
HIP BIOLOX HD 36/-2.5 (Joint) ×3 IMPLANT
HOLDER FOLEY CATH W/STRAP (MISCELLANEOUS) ×3 IMPLANT
INSERT 0 DEG POLY  36 F (Miscellaneous) ×3 IMPLANT
INSERT 0 DEG POLY 36 F (Miscellaneous) ×1 IMPLANT
KIT TURNOVER KIT A (KITS) ×3 IMPLANT
MANIFOLD NEPTUNE II (INSTRUMENTS) ×3 IMPLANT
NS IRRIG 1000ML POUR BTL (IV SOLUTION) ×3 IMPLANT
PACK ANTERIOR HIP CUSTOM (KITS) ×3 IMPLANT
PROTECTOR NERVE ULNAR (MISCELLANEOUS) ×3 IMPLANT
SCREW HEX LP 6.5X20 (Screw) ×3 IMPLANT
SHELL TRIDENT II CLUST SZ 56MM (Shell) ×3 IMPLANT
STEM ACCOLADE SZ 6 (Hips) ×3 IMPLANT
SUT MNCRL AB 4-0 PS2 18 (SUTURE) ×3 IMPLANT
SUT STRATAFIX 0 PDS 27 VIOLET (SUTURE) ×3
SUT VIC AB 0 CT1 36 (SUTURE) ×3 IMPLANT
SUT VIC AB 1 CT1 36 (SUTURE) ×3 IMPLANT
SUT VIC AB 2-0 CT1 27 (SUTURE) ×6
SUT VIC AB 2-0 CT1 TAPERPNT 27 (SUTURE) ×2 IMPLANT
SUTURE STRATFX 0 PDS 27 VIOLET (SUTURE) ×1 IMPLANT
TRAY FOLEY MTR SLVR 16FR STAT (SET/KITS/TRAYS/PACK) ×3 IMPLANT
TUBE SUCTION HIGH CAP CLEAR NV (SUCTIONS) ×3 IMPLANT
WATER STERILE IRR 1000ML POUR (IV SOLUTION) ×6 IMPLANT
YANKAUER SUCT BULB TIP 10FT TU (MISCELLANEOUS) ×3 IMPLANT

## 2020-02-17 NOTE — Interval H&P Note (Signed)
History and Physical Interval Note:  02/17/2020 6:31 AM  Wayne Medina  has presented today for surgery, with the diagnosis of OA LEFT HIP.  The various methods of treatment have been discussed with the patient and family. After consideration of risks, benefits and other options for treatment, the patient has consented to  Procedure(s): TOTAL HIP ARTHROPLASTY ANTERIOR APPROACH (Left) as a surgical intervention.  The patient's history has been reviewed, patient examined, no change in status, stable for surgery.  I have reviewed the patient's chart and labs.  Questions were answered to the patient's satisfaction.     Sheral Apley

## 2020-02-17 NOTE — Transfer of Care (Signed)
Immediate Anesthesia Transfer of Care Note  Patient: Wayne Medina  Procedure(s) Performed: TOTAL HIP ARTHROPLASTY ANTERIOR APPROACH (Left Hip)  Patient Location: PACU  Anesthesia Type:Spinal  Level of Consciousness: awake, alert , oriented and patient cooperative  Airway & Oxygen Therapy: Patient Spontanous Breathing  Post-op Assessment: Report given to RN and Post -op Vital signs reviewed and stable  Post vital signs: Reviewed and stable  Last Vitals:  Vitals Value Taken Time  BP 107/77 02/17/20 1000  Temp    Pulse 93 02/17/20 1000  Resp 15 02/17/20 1000  SpO2 100 % 02/17/20 1000  Vitals shown include unvalidated device data.  Last Pain:  Vitals:   02/17/20 0611  TempSrc: Oral  PainSc:       Patients Stated Pain Goal: 4 (02/17/20 0545)  Complications: No complications documented.

## 2020-02-17 NOTE — Progress Notes (Addendum)
Physical Therapy Treatment Patient Details Name: Wayne Medina MRN: [id number] DOB: [birth year] Today's Date: 02/17/2020    History of Present Illness Patient is 34 y.o. male s/p Lt THA anterior approach on 02/17/20 with PMH significant for AVN of lt hip.    PT Comments    Wayne Medina is a 34 y.o. male POD 0 s/p Lt THA. Patient seen for additional therapy session today and progressing well. He remains limited by pain but muscle strength and balance has improved. Pt now requires min guard/supervision for transfers and gait withcrutchesW. Patient was able to ambulate ~120 feet with crutches and min guard/supervision and cues for safe walker management. Patient educated on safe sequencing for stair mobility and verbalized safe guarding position for people assisting with mobility. Patient instructed in exercises to facilitate ROM and circulation. Patient will benefit from continued skilled PT interventions to address impairments and progress towards PLOF. Patient has met mobility goals at adequate level for discharge home; will continue to follow if pt continues acute stay to progress towards Mod I goals.     Follow Up Recommendations  Follow surgeon's recommendation for DC plan and follow-up therapies;Outpatient PT     Equipment Recommendations  Other (comment) (TBA)    Recommendations for Other Services       Precautions / Restrictions Precautions Precautions: Fall Restrictions Weight Bearing Restrictions: No Other Position/Activity Restrictions: WBAT    Mobility  Bed Mobility Overal bed mobility: Needs Assistance Bed Mobility: Supine to Sit;Sit to Supine     Supine to sit: Min guard;Supervision Sit to supine: Min guard;Supervision   General bed mobility comments: pt using UE's to assist Lt LE off bed and back onto bed. No assist from therapist, pt requried incresaed time to complete.  Transfers Overall transfer level: Needs assistance Equipment used:  Crutches Transfers: Sit to/from Stand Sit to Stand: Min guard;Supervision         General transfer comment: Cues for safe technique with crutches. Pt steadier with rising and min guard for safety with transition of crutch underarms. supervision for safe return to sit EOB.  Ambulation/Gait Ambulation/Gait assistance: Min guard;Supervision Gait Distance (Feet): 120 Feet Assistive device: Crutches Gait Pattern/deviations: Step-to pattern;Decreased stride length;Decreased step length - right;Decreased step length - left;Decreased stance time - left;Decreased weight shift to left;Antalgic;Narrow base of support Gait velocity: decr   General Gait Details: cues for safe step pattern with curtches for 3 point step pattern. pt with reduced weight on Lt LE due to pain. No overt LOB noted.   Stairs Stairs: Yes Stairs assistance: Min guard;Supervision Stair Management: No rails;Step to pattern;Forwards;With crutches Number of Stairs: 6 (2x3) General stair comments: VC's for safe step pattern "up with good down with bad" and for safe sequencing of crutches. No overt LOB noted, pt performed safely and verbalized safe understanding of guarding position for family.   Wheelchair Mobility    Modified Rankin (Stroke Patients Only)       Balance Overall balance assessment: Needs assistance Sitting-balance support: Feet supported Sitting balance-Leahy Scale: Good     Standing balance support: During functional activity;Bilateral upper extremity supported Standing balance-Leahy Scale: Fair               Cognition Arousal/Alertness: Awake/alert Behavior During Therapy: WFL for tasks assessed/performed Overall Cognitive Status: Within Functional Limits for tasks assessed         Exercises Total Joint Exercises Ankle Circles/Pumps: AROM;Both;10 reps;Supine Quad Sets: AROM;Left;Other reps (comment);Supine (2) Short Arc Quad: AROM;Left;Other reps (comment);Supine (2) Heel  Slides:  AROM;Left;Other reps (comment);Supine (3) Hip ABduction/ADduction: AROM;Left;Other reps (comment);Supine (2)    General Comments        Pertinent Vitals/Pain Pain Assessment: Faces Faces Pain Scale: Hurts even more Pain Location: Lt hip Pain Descriptors / Indicators: Aching;Burning;Grimacing;Guarding;Moaning;Sharp Pain Intervention(s): Limited activity within patient's tolerance;Monitored during session;Premedicated before session;Repositioned    Home Living Family/patient expects to be discharged to:: Private residence Living Arrangements: Alone Available Help at Discharge: Family Type of Home: House Home Access: Stairs to enter Entrance Stairs-Rails: None Home Layout: One level Home Equipment: Crutches Additional Comments: pt plans to spend 2-3 days at his dads as he lives alone. he will have assist from father, brother-in-law.    Prior Function        Comments: pt using crutches off and on for about 6-8 weeks due to pain   PT Goals (current goals can now be found in the care plan section) Acute Rehab PT Goals Patient Stated Goal: to get home and stop hurting so badly PT Goal Formulation: With patient Time For Goal Achievement: 02/24/20 Potential to Achieve Goals: Good Progress towards PT goals: Progressing toward goals    Frequency    7X/week      PT Plan Current plan remains appropriate       AM-PAC PT "6 Clicks" Mobility   Outcome Measure  Help needed turning from your back to your side while in a flat bed without using bedrails?: None Help needed moving from lying on your back to sitting on the side of a flat bed without using bedrails?: A Little Help needed moving to and from a bed to a chair (including a wheelchair)?: A Little Help needed standing up from a chair using your arms (e.g., wheelchair or bedside chair)?: A Little Help needed to walk in hospital room?: A Little Help needed climbing 3-5 steps with a railing? : A Little 6 Click Score: 19     End of Session Equipment Utilized During Treatment: Gait belt Activity Tolerance: Patient limited by pain Patient left: in bed;with call bell/phone within reach Nurse Communication: Mobility status;Patient requests pain meds PT Visit Diagnosis: Muscle weakness (generalized) (M62.81);Difficulty in walking, not elsewhere classified (R26.2);Pain;Other abnormalities of gait and mobility (R26.89) Pain - Right/Left: Left Pain - part of body: Hip     Time: 8850-2774 PT Time Calculation (min) (ACUTE ONLY): 34 min  Charges:  $Gait Training: 8-22 mins $Therapeutic Exercise: 8-22 mins                     Verner Mould, DPT Acute Rehabilitation Services  Office [phone] Pager [phone]  02/17/2020 6:20 PM

## 2020-02-17 NOTE — Op Note (Signed)
02/17/2020  9:31 AM  PATIENT:  Wayne Medina   MRN: [id number]  PRE-OPERATIVE DIAGNOSIS:  AVASCULAR NECROSIS LEFT HIP  POST-OPERATIVE DIAGNOSIS:  AVASCULAR NECROSIS LEFT HIP  PROCEDURE:  Procedure(s): TOTAL HIP ARTHROPLASTY ANTERIOR APPROACH  PREOPERATIVE INDICATIONS:    Wayne Medina is an 34 y.o. male who has a diagnosis of <principal problem not specified> and elected for surgical management after failing conservative treatment.  The risks benefits and alternatives were discussed with the patient including but not limited to the risks of nonoperative treatment, versus surgical intervention including infection, bleeding, nerve injury, periprosthetic fracture, the need for revision surgery, dislocation, leg length discrepancy, blood clots, cardiopulmonary complications, morbidity, mortality, among others, and they were willing to proceed.     OPERATIVE REPORT     SURGEON:   Renette Butters, MD    ASSISTANT:  Margy Clarks, PA-C, he was present and scrubbed throughout the case, critical for completion in a timely fashion, and for retraction, instrumentation, and closure.     ANESTHESIA:  General    COMPLICATIONS:  None.     COMPONENTS:  [employer]ker acolade fit femur size 6 with a 36 mm -2.5 head ball and an acetabular shell size 56 with a  polyethylene liner    PROCEDURE IN DETAIL:   The patient was met in the holding area and  identified.  The appropriate hip was identified and marked at the operative site.  The patient was then transported to the OR  and  placed under anesthesia per that record.  At that point, the patient was  placed in the supine position and  secured to the operating room table and all bony prominences padded. He received pre-operative antibiotics    The operative lower extremity was prepped from the iliac crest to the distal leg.  Sterile draping was performed.  Time out was performed prior to incision.      Skin incision was made just 2 cm lateral to  the ASIS  extending in line with the tensor fascia lata. Electrocautery was used to control all bleeders. I dissected down sharply to the fascia of the tensor fascia lata was confirmed that the muscle fibers beneath were running posteriorly. I then incised the fascia over the superficial tensor fascia lata in line with the incision. The fascia was elevated off the anterior aspect of the muscle the muscle was retracted posteriorly and protected throughout the case. I then used electrocautery to incise the tensor fascia lata fascia control and all bleeders. Immediately visible was the fat over top of the anterior neck and capsule.  I removed the anterior fat from the capsule and elevated the rectus muscle off of the anterior capsule. I then removed a large time of capsule. The retractors were then placed over the anterior acetabulum as well as around the superior and inferior neck.  I then made a femoral neck cut. Then used the power corkscrew to remove the femoral head from the acetabulum and thoroughly irrigated the acetabulum. I sized the femoral head.    I then exposed the deep acetabulum, cleared out any tissue including the ligamentum teres.   After adequate visualization, I excised the labrum, and then sequentially reamed.  I then impacted the acetabular implant into place using fluoroscopy for guidance.  Appropriate version and inclination was confirmed clinically matching their bony anatomy, and with fluoroscopy.  I placed a 20 mm screw in the posterior/superio position with an excellent bite.    I then placed the polyethylene  liner in place  I then adducted the leg and released the external rotators from the posterior femur allowing it to be easily delivered up lateral and anterior to the acetabulum for preparation of the femoral canal.    I then prepared the proximal femur using the cookie-cutter and then sequentially reamed and broached.  A trial broach, neck, and head was utilized, and I  reduced the hip and used floroscopy to assess the neck length and femoral implant.  I then impacted the femoral prosthesis into place into the appropriate version. The hip was then reduced and fluoroscopy confirmed appropriate position. Leg lengths were restored.  I then irrigated the hip copiously again with, and repaired the fascia with Vicryl, followed by monocryl for the subcutaneous tissue, Monocryl for the skin, Steri-Strips and sterile gauze. The patient was then awakened and returned to PACU in stable and satisfactory condition. There were no complications.  POST OPERATIVE PLAN: WBAT, DVT px: SCD's/TED, ambulation and chemical dvt px  Edmonia Lynch, MD Orthopedic Surgeon [phone]

## 2020-02-17 NOTE — Evaluation (Signed)
Physical Therapy Evaluation Patient Details Name: Wayne Medina MRN: [id number] DOB: August 04, 1985 Today's Date: 02/17/2020   History of Present Illness  Patient is 34 y.o. male s/p Lt THA anterior approach on 02/17/20 with PMH significant for AVN of lt hip.  Clinical Impression  Wayne Medina is a 34 y.o. male POD 0 s/p Lt THA. Patient reports independence with use of crutches in last ~2 months due to pain with mobility. Patient is now limited by functional impairments (see PT problem list below) and requires min-mod assist for transfers with crutches. Patient was limited this session by pain and ongoing weakness of bil LE's and took several short step requiring min assist to steady and return to EOB. Patient will benefit from continued skilled PT interventions to address impairments and progress towards PLOF. Acute PT will follow for additional session today to progress mobility and stair training in preparation/effort for safe discharge home.     Follow Up Recommendations Follow surgeon's recommendation for DC plan and follow-up therapies;Outpatient PT    Equipment Recommendations  Other (comment) (TBA)    Recommendations for Other Services       Precautions / Restrictions Precautions Precautions: Fall Restrictions Weight Bearing Restrictions: No Other Position/Activity Restrictions: WBAT      Mobility  Bed Mobility Overal bed mobility: Needs Assistance Bed Mobility: Supine to Sit;Sit to Supine     Supine to sit: Min guard;Min assist Sit to supine: Min assist;HOB elevated   General bed mobility comments: ceus for pt to use gait belt to assist with bringing Lt LE off EOB, min guard/light assist to support Lt LE as pt sat up EOB. pt limited by pain and min assist required to raise Lt LE into bed to return to supine.  Transfers Overall transfer level: Needs assistance Equipment used: Crutches Transfers: Sit to/from Stand Sit to Stand: Mod assist         General  transfer comment: Cues for technique with crutches. pt unsteady with rising and min-mod assist required to steady and prevent LOB anteriorly. cues to increase WB in heels to steady.   Ambulation/Gait Ambulation/Gait assistance: Min assist Gait Distance (Feet): 2 Feet Assistive device: Crutches Gait Pattern/deviations: Step-to pattern;Decreased stride length;Decreased step length - right;Decreased step length - left;Decreased stance time - left;Decreased weight shift to left;Antalgic;Narrow base of support Gait velocity: decr   General Gait Details: pt attempted small step forward from EOB, pt unsteady and min assist needed to maintain balance. pt limited by pain  Stairs       Wheelchair Mobility    Modified Rankin (Stroke Patients Only)       Balance Overall balance assessment: Needs assistance Sitting-balance support: Feet supported Sitting balance-Leahy Scale: Fair     Standing balance support: During functional activity;Bilateral upper extremity supported Standing balance-Leahy Scale: Poor              Pertinent Vitals/Pain Pain Assessment: 0-10 Pain Score: 9  Pain Location: Lt hip Pain Descriptors / Indicators: Aching;Burning;Grimacing;Guarding;Moaning;Sharp Pain Intervention(s): Limited activity within patient's tolerance;Monitored during session;Repositioned;Ice applied    Home Living Family/patient expects to be discharged to:: Private residence Living Arrangements: Alone Available Help at Discharge: Family Type of Home: House Home Access: Stairs to enter Entrance Stairs-Rails: None Entrance Stairs-Number of Steps: 3+1 Home Layout: One level Home Equipment: Crutches Additional Comments: pt plans to spend 2-3 days at his dads as he lives alone. he will have assist from father, brother-in-law.    Prior Function  Comments: pt using crutches off and on for about 6-8 weeks due to pain     Hand Dominance   Dominant Hand: Right     Extremity/Trunk Assessment   Upper Extremity Assessment Upper Extremity Assessment: Overall WFL for tasks assessed    Lower Extremity Assessment Lower Extremity Assessment: LLE deficits/detail LLE: Unable to fully assess due to pain LLE Sensation:  (tingling/numb at hips/posterior thigh) LLE Coordination: decreased gross motor (weakness at hips)    Cervical / Trunk Assessment Cervical / Trunk Assessment: Normal  Communication   Communication: No difficulties  Cognition Arousal/Alertness: Awake/alert Behavior During Therapy: WFL for tasks assessed/performed Overall Cognitive Status: Within Functional Limits for tasks assessed                 General Comments      Exercises     Assessment/Plan    PT Assessment Patient needs continued PT services  PT Problem List Decreased strength;Decreased range of motion;Decreased activity tolerance;Decreased balance;Decreased mobility;Decreased knowledge of use of DME;Decreased safety awareness;Decreased knowledge of precautions;Pain       PT Treatment Interventions DME instruction;Gait training;Stair training;Functional mobility training;Therapeutic activities;Therapeutic exercise;Balance training;Patient/family education    PT Goals (Current goals can be found in the Care Plan section)  Acute Rehab PT Goals Patient Stated Goal: to get home and stop hurting so badly PT Goal Formulation: With patient Time For Goal Achievement: 02/24/20 Potential to Achieve Goals: Good    Frequency 7X/week    AM-PAC PT "6 Clicks" Mobility  Outcome Measure Help needed turning from your back to your side while in a flat bed without using bedrails?: None Help needed moving from lying on your back to sitting on the side of a flat bed without using bedrails?: A Little Help needed moving to and from a bed to a chair (including a wheelchair)?: A Lot Help needed standing up from a chair using your arms (e.g., wheelchair or bedside chair)?: A  Lot Help needed to walk in hospital room?: A Lot Help needed climbing 3-5 steps with a railing? : A Lot 6 Click Score: 15    End of Session Equipment Utilized During Treatment: Gait belt Activity Tolerance: Patient limited by pain Patient left: in bed;with call bell/phone within reach Nurse Communication: Mobility status;Patient requests pain meds PT Visit Diagnosis: Muscle weakness (generalized) (M62.81);Difficulty in walking, not elsewhere classified (R26.2);Pain;Other abnormalities of gait and mobility (R26.89) Pain - Right/Left: Left Pain - part of body: Hip    Time: 0998-3382 PT Time Calculation (min) (ACUTE ONLY): 29 min   Charges:   PT Evaluation $PT Eval Low Complexity: 1 Low PT Treatments $Therapeutic Activity: 8-22 mins        Wynn Maudlin, DPT Acute Rehabilitation Services  Office [phone] Pager [phone]  02/17/2020 2:44 PM

## 2020-02-17 NOTE — Discharge Instructions (Signed)
You may bear weight as tolerated. Keep your dressing on and dry until follow up. Take medicine to prevent blood clots as directed. Take pain medicine as needed with the goal of transitioning to over the counter medicines.  If needed, you may increase breakthrough pain medication (oxycodone) for the first few days post op - up to 2 tablets every 4 hours.  Stop this medication as soon as you are able.  INSTRUCTIONS AFTER JOINT REPLACEMENT   o Remove items at home which could result in a fall. This includes throw rugs or furniture in walking pathways o ICE to the affected joint every three hours while awake for 30 minutes at a time, for at least the first 3-5 days, and then as needed for pain and swelling.  Continue to use ice for pain and swelling. You may notice swelling that will progress down to the foot and ankle.  This is normal after surgery.  Elevate your leg when you are not up walking on it.   o Continue to use the breathing machine you got in the hospital (incentive spirometer) which will help keep your temperature down.  It is common for your temperature to cycle up and down following surgery, especially at night when you are not up moving around and exerting yourself.  The breathing machine keeps your lungs expanded and your temperature down.   DIET:  As you were doing prior to hospitalization, we recommend a well-balanced diet.  DRESSING / WOUND CARE / SHOWERING  You may shower 3 days after surgery, but keep the wounds dry during showering.  You may use an occlusive plastic wrap (Press'n Seal for example) with blue painter's tape at edges, NO SOAKING/SUBMERGING IN THE BATHTUB.  If the bandage gets wet, change with a clean dry gauze.  If the incision gets wet, pat the wound dry with a clean towel.  ACTIVITY  o Increase activity slowly as tolerated, but follow the weight bearing instructions below.   o No driving for 6 weeks or until further direction given by your physician.  You  cannot drive while taking narcotics.  o No lifting or carrying greater than 10 lbs. until further directed by your surgeon. o Avoid periods of inactivity such as sitting longer than an hour when not asleep. This helps prevent blood clots.  o You may return to work once you are authorized by your doctor.     WEIGHT BEARING   Weight bearing as tolerated with assist device (walker, cane, etc) as directed, use it as long as suggested by your surgeon or therapist, typically at least 4-6 weeks.   EXERCISES  Results after joint replacement surgery are often greatly improved when you follow the exercise, range of motion and muscle strengthening exercises prescribed by your doctor. Safety measures are also important to protect the joint from further injury. Any time any of these exercises cause you to have increased pain or swelling, decrease what you are doing until you are comfortable again and then slowly increase them. If you have problems or questions, call your caregiver or physical therapist for advice.   Rehabilitation is important following a joint replacement. After just a few days of immobilization, the muscles of the leg can become weakened and shrink (atrophy).  These exercises are designed to build up the tone and strength of the thigh and leg muscles and to improve motion. Often times heat used for twenty to thirty minutes before working out will loosen up your tissues and help with   improving the range of motion but do not use heat for the first two weeks following surgery (sometimes heat can increase post-operative swelling).   These exercises can be done on a training (exercise) mat, on the floor, on a table or on a bed. Use whatever works the best and is most comfortable for you.    Use music or television while you are exercising so that the exercises are a pleasant break in your day. This will make your life better with the exercises acting as a break in your routine that you can look  forward to.   Perform all exercises about fifteen times, three times per day or as directed.  You should exercise both the operative leg and the other leg as well.  Exercises include:   . Quad Sets - Tighten up the muscle on the front of the thigh (Quad) and hold for 5-10 seconds.   . Straight Leg Raises - With your knee straight (if you were given a brace, keep it on), lift the leg to 60 degrees, hold for 3 seconds, and slowly lower the leg.  Perform this exercise against resistance later as your leg gets stronger.  . Leg Slides: Lying on your back, slowly slide your foot toward your buttocks, bending your knee up off the floor (only go as far as is comfortable). Then slowly slide your foot back down until your leg is flat on the floor again.  . Angel Wings: Lying on your back spread your legs to the side as far apart as you can without causing discomfort.  . Hamstring Strength:  Lying on your back, push your heel against the floor with your leg straight by tightening up the muscles of your buttocks.  Repeat, but this time bend your knee to a comfortable angle, and push your heel against the floor.  You may put a pillow under the heel to make it more comfortable if necessary.   A rehabilitation program following joint replacement surgery can speed recovery and prevent re-injury in the future due to weakened muscles. Contact your doctor or a physical therapist for more information on knee rehabilitation.    CONSTIPATION  Constipation is defined medically as fewer than three stools per week and severe constipation as less than one stool per week.  Even if you have a regular bowel pattern at home, your normal regimen is likely to be disrupted due to multiple reasons following surgery.  Combination of anesthesia, postoperative narcotics, change in appetite and fluid intake all can affect your bowels.   YOU MUST use at least one of the following options; they are listed in order of increasing strength  to get the job done.  They are all available over the counter, and you may need to use some, POSSIBLY even all of these options:    Drink plenty of fluids (prune juice may be helpful) and high fiber foods Colace 100 mg by mouth twice a day  Senokot for constipation as directed and as needed Dulcolax (bisacodyl), take with full glass of water  Miralax (polyethylene glycol) once or twice a day as needed.  If you have tried all these things and are unable to have a bowel movement in the first 3-4 days after surgery call either your surgeon or your primary doctor.    If you experience loose stools or diarrhea, hold the medications until you stool forms back up.  If your symptoms do not get better within 1 week or if they get worse,   check with your doctor.  If you experience "the worst abdominal pain ever" or develop nausea or vomiting, please contact the office immediately for further recommendations for treatment.   ITCHING:  If you experience itching with your medications, try taking only a single pain pill, or even half a pain pill at a time.  You can also use Benadryl over the counter for itching or also to help with sleep.   TED HOSE STOCKINGS:  Use stockings on both legs until for at least 2 weeks or as directed by physician office. They may be removed at night for sleeping.  MEDICATIONS:  See your medication summary on the "After Visit Summary" that nursing will review with you.  You may have some home medications which will be placed on hold until you complete the course of blood thinner medication.  It is important for you to complete the blood thinner medication as prescribed.  PRECAUTIONS:  If you experience chest pain or shortness of breath - call 911 immediately for transfer to the hospital emergency department.   If you develop a fever greater that 101 F, purulent drainage from wound, increased redness or drainage from wound, foul odor from the wound/dressing, or calf pain - CONTACT  YOUR SURGEON.                                                   FOLLOW-UP APPOINTMENTS:  If you do not already have a post-op appointment, please call the office for an appointment to be seen by your surgeon.  Guidelines for how soon to be seen are listed in your "After Visit Summary", but are typically between 1-4 weeks after surgery.  OTHER INSTRUCTIONS:  Dental Antibiotics:  In most cases prophylactic antibiotics for Dental procdeures after total joint surgery are not necessary.  Exceptions are as follows:  1. History of prior total joint infection  2. Severely immunocompromised (Organ Transplant, cancer chemotherapy, Rheumatoid biologic meds such as Humera)  3. Poorly controlled diabetes (A1C &gt; 8.0, blood glucose over 200)  If you have one of these conditions, contact your surgeon for an antibiotic prescription, prior to your dental procedure.   MAKE SURE YOU:  . Understand these instructions.  . Get help right away if you are not doing well or get worse.    Thank you for letting us be a part of your medical care team.  It is a privilege we respect greatly.  We hope these instructions will help you stay on track for a fast and full recovery!     

## 2020-02-17 NOTE — Anesthesia Procedure Notes (Signed)
Spinal  Patient location during procedure: OR Start time: 02/17/2020 7:32 AM End time: 02/17/2020 7:38 AM Staffing Performed: anesthesiologist  Anesthesiologist: Lannie Fields, DO Preanesthetic Checklist Completed: patient identified, IV checked, risks and benefits discussed, surgical consent, monitors and equipment checked, pre-op evaluation and timeout performed Spinal Block Patient position: sitting Prep: DuraPrep and site prepped and draped Patient monitoring: cardiac monitor, continuous pulse ox and blood pressure Approach: midline Location: L3-4 Injection technique: single-shot Needle Needle type: Pencan  Needle gauge: 24 G Needle length: 9 cm Assessment Sensory level: T6 Additional Notes Functioning IV was confirmed and monitors were applied. Sterile prep and drape, including hand hygiene and sterile gloves were used. The patient was positioned and the spine was prepped. The skin was anesthetized with lidocaine.  Free flow of clear CSF was obtained prior to injecting local anesthetic into the CSF.  The spinal needle aspirated freely following injection.  The needle was carefully withdrawn.  The patient tolerated the procedure well.

## 2020-02-17 NOTE — Anesthesia Postprocedure Evaluation (Signed)
Anesthesia Post Note  Patient: Wayne Medina  Procedure(s) Performed: TOTAL HIP ARTHROPLASTY ANTERIOR APPROACH (Left Hip)     Patient location during evaluation: PACU Anesthesia Type: MAC and Spinal Level of consciousness: oriented and awake and alert Pain management: pain level controlled Vital Signs Assessment: post-procedure vital signs reviewed and stable Respiratory status: spontaneous breathing, respiratory function stable and patient connected to nasal cannula oxygen Cardiovascular status: blood pressure returned to baseline and stable Postop Assessment: no headache, no backache, no apparent nausea or vomiting and spinal receding Anesthetic complications: no   No complications documented.  Last Vitals:  Vitals:   02/17/20 1030 02/17/20 1045  BP: 116/76 121/80  Pulse: 65 68  Resp: 12 11  Temp:  36.4 C  SpO2: 99% 99%    Last Pain:  Vitals:   02/17/20 1045  TempSrc:   PainSc: [place]

## 2020-02-18 ENCOUNTER — Encounter (HOSPITAL_COMMUNITY): Payer: Self-pay | Admitting: Orthopedic Surgery

## 2020-02-20 ENCOUNTER — Telehealth: Payer: Self-pay | Admitting: *Deleted

## 2020-02-20 NOTE — Telephone Encounter (Signed)
Returned call to patient, left message to call back 

## 2020-02-20 NOTE — Telephone Encounter (Signed)
Patient called, no answer. Left message for patient to return call to [facility] at [phone]. Patient will need to be informed of potential exposure to employee who later tested positive for COVID-19 and offered testing. If patient returns call please transfer to [facility].

## 2021-04-28 DIAGNOSIS — K219 Gastro-esophageal reflux disease without esophagitis: Secondary | ICD-10-CM | POA: Insufficient documentation

## 2021-04-28 DIAGNOSIS — F902 Attention-deficit hyperactivity disorder, combined type: Secondary | ICD-10-CM | POA: Insufficient documentation

## 2021-05-10 ENCOUNTER — Encounter (HOSPITAL_COMMUNITY): Payer: Self-pay | Admitting: Orthopedic Surgery

## 2021-05-10 DIAGNOSIS — Z96641 Presence of right artificial hip joint: Secondary | ICD-10-CM | POA: Diagnosis not present

## 2021-05-10 DIAGNOSIS — Z96642 Presence of left artificial hip joint: Secondary | ICD-10-CM

## 2021-05-10 HISTORY — DX: Presence of left artificial hip joint: Z96.642

## 2021-05-10 NOTE — H&P (Signed)
TOTAL HIP ADMISSION H&P  Patient is admitted for right total hip arthroplasty.  Subjective:  Chief Complaint: right hip pain  HPI: Wayne Medina, 35 y.o. male, has a history of pain and functional disability in the right hip(s) due to  AVN  and patient has failed non-surgical conservative treatments for greater than 12 weeks to include NSAID's and/or analgesics, corticosteriod injections, use of assistive devices, and activity modification.  Onset of symptoms was abrupt starting >10 years ago with gradually worsening course since that time.The patient noted no past surgery on the right hip(s).  Patient currently rates pain in the right hip at 10 out of 10 with activity. Patient has night pain, worsening of pain with activity and weight bearing, trendelenberg gait, and pain that interfers with activities of daily living. 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 This condition presents safety issues increasing the risk of falls. There is no current active infection.  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   Current Outpatient Medications  Medication Sig Dispense Refill Last Dose   acetaminophen (TYLENOL) 500 MG tablet Take 1,000 mg by mouth  every 6 (six) hours as needed for moderate pain.      amphetamine-dextroamphetamine (ADDERALL XR) 15 MG 24 hr capsule Take 15 mg by mouth every morning.      diphenhydrAMINE HCl, Sleep, (ZZZQUIL) 25 MG CAPS Take 25 mg by mouth daily as needed (Sleep).      gabapentin (NEURONTIN) 100 MG capsule Take 100-300 mg by mouth daily as needed for pain.      Menthol (BIOFREEZE) 5 % PTCH Apply 1 patch topically daily as needed (pain).      No Known Allergies  Social History   Tobacco Use   Smoking status: Every Day    Packs/day: 0.25    Types: Cigarettes   Smokeless tobacco: Never  Substance Use Topics   Alcohol use: Never    No family history on file.   Review of Systems  Musculoskeletal:  Positive for back pain, gait problem and joint swelling.  All other systems reviewed and are negative.  Objective:  Physical Exam Constitutional:      Appearance: He is normal weight.  HENT:     Right Ear: External ear normal.     Left Ear: External ear normal.     Nose: Nose normal.     Mouth/Throat:     Pharynx: Oropharynx is clear.  Eyes:     Conjunctiva/sclera: Conjunctivae normal.  Cardiovascular:     Rate and Rhythm: Normal rate.     Pulses: Normal pulses.  Pulmonary:     Effort: Pulmonary effort is normal.  Abdominal:     General: Abdomen is flat.  Genitourinary:    Comments: Not pertinent to current symptomatology therefore  not examined.  Musculoskeletal:     Cervical back: Normal range of motion.     Comments: Right hip painful range of motion   Unable to bear weight.  DNVI  Skin:    General: Skin is warm.  Neurological:     General: No focal deficit present.     Mental Status: He is alert.  Psychiatric:        Mood and Affect: Mood normal.        Behavior: Behavior normal.    Vital signs in last 24 hours: Temp:  [97.9 F (36.6 C)] 97.9 F (36.6 C) (11/22 1400) Pulse Rate:  [114] 114 (11/22 1400) BP: (119)/(85) 119/85 (11/22 1400) SpO2:  [100 %] 100 % (11/22  1400) Weight:  [59.9 kg] 59.9 kg (11/22 1400)  Labs:   Estimated body mass index is 17.9 kg/m as calculated from the following:   Height as of this encounter: 6' (1.829 m).   Weight as of this encounter: 59.9 kg.   Imaging Review Plain radiographs demonstrate severe degenerative joint disease of the right hip(s). The bone quality appears to be good for age and reported activity level.      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 The treatment options including medical management, injection therapy, arthroscopy and arthroplasty were discussed at length. The risks and benefits of total hip arthroplasty were presented and reviewed. The risks due to aseptic loosening, infection, stiffness, dislocation/subluxation,  thromboembolic complications and other imponderables were discussed.  The patient acknowledged the explanation, agreed to proceed with the plan and consent was signed. Patient is being admitted for inpatient treatment for surgery, pain control, PT, OT, prophylactic antibiotics, VTE prophylaxis, progressive ambulation and ADL's and discharge planning.The patient is planning to be discharged same to home.  Outpatient physical therapy at 12-1 at 3 pm at [facility].

## 2021-05-11 ENCOUNTER — Other Ambulatory Visit: Payer: Self-pay

## 2021-05-11 ENCOUNTER — Encounter (HOSPITAL_COMMUNITY): Payer: Self-pay | Admitting: Orthopedic Surgery

## 2021-05-11 NOTE — Patient Instructions (Addendum)
DUE TO COVID-19 ONLY ONE VISITOR IS ALLOWED TO COME WITH YOU AND STAY IN THE WAITING ROOM ONLY DURING PRE OP AND PROCEDURE.   **NO VISITORS ARE ALLOWED IN THE SHORT STAY AREA OR RECOVERY ROOM!!**         Your procedure is scheduled on: Tuesday, Nov. 29, 2022   Report to [facility] Main Entrance    Report to admitting at 8:45 AM   Call this number if you have problems the morning of surgery [phone]   Do not eat food :After Midnight.   May have liquids until 8:30 AM   day of surgery  CLEAR LIQUID DIET  Foods Allowed                                                                     Foods Excluded  Water, Black Coffee and tea, regular and decaf                             liquids that you cannot  Plain Jell-O in any flavor  (No red)                                           see through such as: Fruit ices (not with fruit pulp)                                     milk, soups, orange juice              Iced Popsicles (No red)                                    All solid food                                   Apple juices Sports drinks like Gatorade (No red) Lightly seasoned clear broth or consume(fat free) Sugar  Sample Menu Breakfast                                Lunch                                     Supper Cranberry juice                    Beef broth                            Chicken broth Jell-O                                     Grape juice  Apple juice Coffee or tea                        Jell-O                                      Popsicle                                                Coffee or tea                        Coffee or tea      Oral Hygiene is also important to reduce your risk of infection.                                    Remember - BRUSH YOUR TEETH THE MORNING OF SURGERY WITH YOUR REGULAR TOOTHPASTE   Do NOT smoke after Midnight   Take these medicines the morning of surgery with A SIP OF WATER: NONE                               You may not have any metal on your body including jewelry, and body piercing             Do not wear lotions, powders, perfumes/cologne, or deodorant  Do not wear nail polish including gel and S&S, artificial/acrylic nails, or any other type of covering on natural nails including finger and toenails. If you have artificial nails, gel coating, etc. that needs to be removed by a nail salon please have this removed prior to surgery or surgery may need to be canceled/ delayed if the surgeon/ anesthesia feels like they are unable to be safely monitored.   Do not shave  48 hours prior to surgery.               Men may shave face and neck.   Do not bring valuables to the hospital. [facility] IS NOT             RESPONSIBLE   FOR VALUABLES.   Contacts, dentures or bridgework may not be worn into surgery.    Patients discharged on the day of surgery will not be allowed to drive home.   Special Instructions: Bring a copy of your healthcare power of attorney and living will documents         the day of surgery if you haven't scanned them before.              Please read over the following fact sheets you were given: IF YOU HAVE QUESTIONS ABOUT YOUR PRE-OP INSTRUCTIONS PLEASE CALL [phone]         Incentive Spirometer  An incentive spirometer is a tool that can help keep your lungs clear and active. This tool measures how well you are filling your lungs with each breath. Taking long deep breaths may help reverse or decrease the chance of developing breathing (pulmonary) problems (especially infection) following: A long period of time when you are unable to move or be active. BEFORE THE PROCEDURE  If the  spirometer includes an indicator to show your best effort, your nurse or respiratory therapist will set it to a desired goal. If possible, sit up straight or lean slightly forward. Try not to slouch. Hold the incentive spirometer in an upright position. INSTRUCTIONS FOR  USE  Sit on the edge of your bed if possible, or sit up as far as you can in bed or on a chair. Hold the incentive spirometer in an upright position. Breathe out normally. Place the mouthpiece in your mouth and seal your lips tightly around it. Breathe in slowly and as deeply as possible, raising the piston or the ball toward the top of the column. Hold your breath for 3-5 seconds or for as long as possible. Allow the piston or ball to fall to the bottom of the column. Remove the mouthpiece from your mouth and breathe out normally. Rest for a few seconds and repeat Steps 1 through 7 at least 10 times every 1-2 hours when you are awake. Take your time and take a few normal breaths between deep breaths. The spirometer may include an indicator to show your best effort. Use the indicator as a goal to work toward during each repetition. After each set of 10 deep breaths, practice coughing to be sure your lungs are clear. If you have an incision (the cut made at the time of surgery), support your incision when coughing by placing a pillow or rolled up towels firmly against it. Once you are able to get out of bed, walk around indoors and cough well. You may stop using the incentive spirometer when instructed by your caregiver.  RISKS AND COMPLICATIONS Take your time so you do not get dizzy or light-headed. If you are in pain, you may need to take or ask for pain medication before doing incentive spirometry. It is harder to take a deep breath if you are having pain. AFTER USE Rest and breathe slowly and easily. It can be helpful to keep track of a log of your progress. Your caregiver can provide you with a simple table to help with this. If you are using the spirometer at home, follow these instructions: SEEK MEDICAL CARE IF:  You are having difficultly using the spirometer. You have trouble using the spirometer as often as instructed. Your pain medication is not giving enough relief while using the  spirometer. You develop fever of 100.5 F (38.1 C) or higher. SEEK IMMEDIATE MEDICAL CARE IF:  You cough up bloody sputum that had not been present before. You develop fever of 102 F (38.9 C) or greater. You develop worsening pain at or near the incision site. MAKE SURE YOU:  Understand these instructions. Will watch your condition. Will get help right away if you are not doing well or get worse. Document Released: 10/16/2006 Document Revised: 08/28/2011 Document Reviewed: 12/17/2006 Exit[employer] Patient Information 2014 [employer], [place].   ________________________________________________________________________  WHAT IS A BLOOD TRANSFUSION? Blood Transfusion Information  A transfusion is the replacement of blood or some of its parts. Blood is made up of multiple cells which provide different functions. Red blood cells carry oxygen and are used for blood loss replacement. White blood cells fight against infection. Platelets control bleeding. Plasma helps clot blood. Other blood products are available for specialized needs, such as hemophilia or other clotting disorders. BEFORE THE TRANSFUSION  Who gives blood for transfusions?  Healthy volunteers who are fully evaluated to make sure their blood is safe. This is blood bank blood. Transfusion therapy is the  safest it has ever been in the practice of medicine. Before blood is taken from a donor, a complete history is taken to make sure that person has no history of diseases nor engages in risky social behavior (examples are intravenous drug use or sexual activity with multiple partners). The donor's travel history is screened to minimize risk of transmitting infections, such as malaria. The donated blood is tested for signs of infectious diseases, such as HIV and hepatitis. The blood is then tested to be sure it is compatible with you in order to minimize the chance of a transfusion reaction. If you or a relative donates blood, this is often  done in anticipation of surgery and is not appropriate for emergency situations. It takes many days to process the donated blood. RISKS AND COMPLICATIONS Although transfusion therapy is very safe and saves many lives, the main dangers of transfusion include:  Getting an infectious disease. Developing a transfusion reaction. This is an allergic reaction to something in the blood you were given. Every precaution is taken to prevent this. The decision to have a blood transfusion has been considered carefully by your caregiver before blood is given. Blood is not given unless the benefits outweigh the risks. AFTER THE TRANSFUSION Right after receiving a blood transfusion, you will usually feel much better and more energetic. This is especially true if your red blood cells have gotten low (anemic). The transfusion raises the level of the red blood cells which carry oxygen, and this usually causes an energy increase. The nurse administering the transfusion will monitor you carefully for complications. HOME CARE INSTRUCTIONS  No special instructions are needed after a transfusion. You may find your energy is better. Speak with your caregiver about any limitations on activity for underlying diseases you may have. SEEK MEDICAL CARE IF:  Your condition is not improving after your transfusion. You develop redness or irritation at the intravenous (IV) site. SEEK IMMEDIATE MEDICAL CARE IF:  Any of the following symptoms occur over the next 12 hours: Shaking chills. You have a temperature by mouth above 102 F (38.9 C), not controlled by medicine. Chest, back, or muscle pain. People around you feel you are not acting correctly or are confused. Shortness of breath or difficulty breathing. Dizziness and fainting. You get a rash or develop hives. You have a decrease in urine output. Your urine turns a dark color or changes to pink, red, or brown. Any of the following symptoms occur over the next 10  days: You have a temperature by mouth above 102 F (38.9 C), not controlled by medicine. Shortness of breath. Weakness after normal activity. The white part of the eye turns yellow (jaundice). You have a decrease in the amount of urine or are urinating less often. Your urine turns a dark color or changes to pink, red, or brown. Document Released: 06/02/2000 Document Revised: 08/28/2011 Document Reviewed: 01/20/2008 [facility] Patient Information 2014 [place], [place].  _______________________________________________________________________

## 2021-05-11 NOTE — Progress Notes (Addendum)
For Short Stay: COVID SWAB appointment date: N/A Date of COVID positive in last 90 days: No   For Anesthesia: PCP - Herma Carson, MD, Joanie Coddington, PA-C 04/28/21 pre op clearance note care everywhere Cardiologist - N/A  Chest x-ray - N/A EKG - 05/11/21 request from surgeon's  Stress Test - N/A ECHO - N/A Cardiac Cath - N/A Pacemaker/ICD device last checked: N/A  Sleep Study - Yes CPAP - No  Fasting Blood Sugar - N/A Checks Blood Sugar __N/A___ times a day  Blood Thinner Instructions: N/A Aspirin Instructions: N/A Last Dose: N/A  Activity level:  Able to exercise without chest pain and/or shortness of breath       Anesthesia review: OSA doesn't use CPAP  Patient denies shortness of breath, fever, cough and chest pain at PAT appointment   Patient verbalized understanding of instructions that were given to them at the PAT appointment. Patient was also instructed that they will need to review over the PAT instructions again at home before surgery.

## 2021-05-17 ENCOUNTER — Ambulatory Visit (HOSPITAL_COMMUNITY): Payer: No Typology Code available for payment source | Admitting: Anesthesiology

## 2021-05-17 ENCOUNTER — Other Ambulatory Visit: Payer: Self-pay

## 2021-05-17 ENCOUNTER — Ambulatory Visit (HOSPITAL_COMMUNITY): Payer: No Typology Code available for payment source

## 2021-05-17 ENCOUNTER — Ambulatory Visit (HOSPITAL_COMMUNITY)
Admission: RE | Admit: 2021-05-17 | Discharge: 2021-05-17 | Disposition: A | Discharge status: Home / Self Care | Payer: No Typology Code available for payment source | Attending: Orthopedic Surgery | Admitting: Orthopedic Surgery

## 2021-05-17 ENCOUNTER — Encounter (HOSPITAL_COMMUNITY): Payer: Self-pay | Admitting: Orthopedic Surgery

## 2021-05-17 ENCOUNTER — Encounter (HOSPITAL_COMMUNITY)
Admission: RE | Disposition: A | Discharge status: Home / Self Care | Payer: Self-pay | Source: Home / Self Care | Attending: Orthopedic Surgery

## 2021-05-17 DIAGNOSIS — M1611 Unilateral primary osteoarthritis, right hip: Secondary | ICD-10-CM | POA: Insufficient documentation

## 2021-05-17 DIAGNOSIS — M879 Osteonecrosis, unspecified: Secondary | ICD-10-CM | POA: Diagnosis not present

## 2021-05-17 DIAGNOSIS — Z01818 Encounter for other preprocedural examination: Secondary | ICD-10-CM

## 2021-05-17 DIAGNOSIS — Z96642 Presence of left artificial hip joint: Secondary | ICD-10-CM | POA: Diagnosis present

## 2021-05-17 DIAGNOSIS — F17200 Nicotine dependence, unspecified, uncomplicated: Secondary | ICD-10-CM | POA: Diagnosis not present

## 2021-05-17 DIAGNOSIS — M8700 Idiopathic aseptic necrosis of unspecified bone: Secondary | ICD-10-CM | POA: Diagnosis present

## 2021-05-17 DIAGNOSIS — Z419 Encounter for procedure for purposes other than remedying health state, unspecified: Secondary | ICD-10-CM

## 2021-05-17 DIAGNOSIS — Z96641 Presence of right artificial hip joint: Secondary | ICD-10-CM | POA: Diagnosis not present

## 2021-05-17 HISTORY — DX: Idiopathic aseptic necrosis of unspecified bone: M87.00

## 2021-05-17 HISTORY — DX: Sleep apnea, unspecified: G47.30

## 2021-05-17 HISTORY — DX: Attention-deficit hyperactivity disorder, unspecified type: F90.9

## 2021-05-17 HISTORY — DX: Gastro-esophageal reflux disease without esophagitis: K21.9

## 2021-05-17 HISTORY — DX: Pneumonia, unspecified organism: J18.9

## 2021-05-17 HISTORY — DX: Personal history of peptic ulcer disease: Z87.11

## 2021-05-17 HISTORY — PX: TOTAL HIP ARTHROPLASTY: SHX124

## 2021-05-17 LAB — TYPE AND SCREEN
ABO/RH(D): A POS
Antibody Screen: NEGATIVE

## 2021-05-17 LAB — SURGICAL PCR SCREEN
MRSA, PCR: NEGATIVE
Staphylococcus aureus: NEGATIVE

## 2021-05-17 SURGERY — ARTHROPLASTY, HIP, TOTAL, ANTERIOR APPROACH
Anesthesia: Spinal | Site: Hip | Laterality: Right

## 2021-05-17 MED ORDER — LACTATED RINGERS IV BOLUS
250.0000 mL | Freq: Once | INTRAVENOUS | Status: AC
  Administered 2021-05-17: 250 mL via INTRAVENOUS

## 2021-05-17 MED ORDER — ASPIRIN EC 81 MG PO TBEC: 81.0000 mg | DELAYED_RELEASE_TABLET | Freq: Two times a day (BID) | ORAL | 0 refills | Status: AC

## 2021-05-17 MED ORDER — OXYCODONE HCL 5 MG PO TABS
5.0000 mg | ORAL_TABLET | ORAL | Status: DC | PRN
  Administered 2021-05-17: 5 mg via ORAL

## 2021-05-17 MED ORDER — MIDAZOLAM HCL 2 MG/2ML IJ SOLN
INTRAMUSCULAR | Status: DC | PRN
  Administered 2021-05-17: 2 mg via INTRAVENOUS

## 2021-05-17 MED ORDER — DEXAMETHASONE SODIUM PHOSPHATE 10 MG/ML IJ SOLN
INTRAMUSCULAR | Status: AC
  Filled 2021-05-17: qty 1

## 2021-05-17 MED ORDER — BUPIVACAINE LIPOSOME 1.3 % IJ SUSP
INTRAMUSCULAR | Status: AC
  Filled 2021-05-17: qty 10

## 2021-05-17 MED ORDER — FENTANYL CITRATE (PF) 100 MCG/2ML IJ SOLN
INTRAMUSCULAR | Status: DC | PRN
  Administered 2021-05-17 (×2): 50 ug via INTRAVENOUS

## 2021-05-17 MED ORDER — FENTANYL CITRATE (PF) 100 MCG/2ML IJ SOLN
INTRAMUSCULAR | Status: AC
  Filled 2021-05-17: qty 2

## 2021-05-17 MED ORDER — PROPOFOL 10 MG/ML IV BOLUS
INTRAVENOUS | Status: DC | PRN
  Administered 2021-05-17 (×3): 20 mg via INTRAVENOUS
  Administered 2021-05-17: 30 mg via INTRAVENOUS
  Administered 2021-05-17: 10 mg via INTRAVENOUS

## 2021-05-17 MED ORDER — ORAL CARE MOUTH RINSE: 15.0000 mL | Freq: Once | OROMUCOSAL | Status: AC

## 2021-05-17 MED ORDER — LACTATED RINGERS IV BOLUS
500.0000 mL | Freq: Once | INTRAVENOUS | Status: AC
  Administered 2021-05-17: 500 mL via INTRAVENOUS

## 2021-05-17 MED ORDER — ACETAMINOPHEN 500 MG PO TABS
1000.0000 mg | ORAL_TABLET | Freq: Once | ORAL | Status: AC
  Administered 2021-05-17: 1000 mg via ORAL

## 2021-05-17 MED ORDER — METHOCARBAMOL 500 MG IVPB - SIMPLE MED
INTRAVENOUS | Status: AC
  Filled 2021-05-17: qty 50

## 2021-05-17 MED ORDER — LACTATED RINGERS IV SOLN: INTRAVENOUS | Status: DC

## 2021-05-17 MED ORDER — CEFAZOLIN SODIUM-DEXTROSE 2-4 GM/100ML-% IV SOLN
2.0000 g | INTRAVENOUS | Status: AC
  Administered 2021-05-17: 2 g via INTRAVENOUS

## 2021-05-17 MED ORDER — FENTANYL CITRATE PF 50 MCG/ML IJ SOSY
25.0000 ug | PREFILLED_SYRINGE | INTRAMUSCULAR | Status: DC | PRN
  Administered 2021-05-17 (×3): 50 ug via INTRAVENOUS

## 2021-05-17 MED ORDER — METHOCARBAMOL 500 MG PO TABS: 500.0000 mg | ORAL_TABLET | Freq: Four times a day (QID) | ORAL | Status: DC | PRN

## 2021-05-17 MED ORDER — DEXAMETHASONE SODIUM PHOSPHATE 10 MG/ML IJ SOLN: 8.0000 mg | Freq: Once | INTRAMUSCULAR | Status: DC

## 2021-05-17 MED ORDER — CEFAZOLIN SODIUM-DEXTROSE 2-4 GM/100ML-% IV SOLN
INTRAVENOUS | Status: AC
  Filled 2021-05-17: qty 100

## 2021-05-17 MED ORDER — SODIUM CHLORIDE (PF) 0.9 % IJ SOLN
INTRAMUSCULAR | Status: AC
  Filled 2021-05-17: qty 20

## 2021-05-17 MED ORDER — OXYCODONE HCL 5 MG PO TABS: 5.0000 mg | ORAL_TABLET | Freq: Four times a day (QID) | ORAL | 0 refills | Status: AC | PRN

## 2021-05-17 MED ORDER — BUPIVACAINE LIPOSOME 1.3 % IJ SUSP: 10.0000 mL | Freq: Once | INTRAMUSCULAR | Status: DC

## 2021-05-17 MED ORDER — DEXMEDETOMIDINE (PRECEDEX) IN NS 20 MCG/5ML (4 MCG/ML) IV SYRINGE
PREFILLED_SYRINGE | INTRAVENOUS | Status: AC
  Filled 2021-05-17: qty 5

## 2021-05-17 MED ORDER — TRANEXAMIC ACID-NACL 1000-0.7 MG/100ML-% IV SOLN
INTRAVENOUS | Status: AC
  Filled 2021-05-17: qty 100

## 2021-05-17 MED ORDER — BUPIVACAINE IN DEXTROSE 0.75-8.25 % IT SOLN
INTRATHECAL | Status: DC | PRN
  Administered 2021-05-17: 1.8 mL via INTRATHECAL

## 2021-05-17 MED ORDER — POVIDONE-IODINE 10 % EX SWAB
2.0000 "application " | Freq: Once | CUTANEOUS | Status: AC
  Administered 2021-05-17: 2 via TOPICAL

## 2021-05-17 MED ORDER — 0.9 % SODIUM CHLORIDE (POUR BTL) OPTIME
TOPICAL | Status: DC | PRN
  Administered 2021-05-17: 1000 mL

## 2021-05-17 MED ORDER — TRANEXAMIC ACID-NACL 1000-0.7 MG/100ML-% IV SOLN
1000.0000 mg | INTRAVENOUS | Status: AC
  Administered 2021-05-17: 1000 mg via INTRAVENOUS

## 2021-05-17 MED ORDER — ONDANSETRON 4 MG PO TBDP: 4.0000 mg | ORAL_TABLET | Freq: Two times a day (BID) | ORAL | 0 refills | Status: AC | PRN

## 2021-05-17 MED ORDER — POVIDONE-IODINE 10 % EX SWAB: 2.0000 "application " | Freq: Once | CUTANEOUS | Status: DC

## 2021-05-17 MED ORDER — OMEPRAZOLE MAGNESIUM 20 MG PO TBEC: 20.0000 mg | DELAYED_RELEASE_TABLET | Freq: Every day | ORAL | 0 refills | Status: AC

## 2021-05-17 MED ORDER — METHOCARBAMOL 500 MG IVPB - SIMPLE MED
500.0000 mg | Freq: Four times a day (QID) | INTRAVENOUS | Status: DC | PRN
  Administered 2021-05-17: 500 mg via INTRAVENOUS

## 2021-05-17 MED ORDER — DEXAMETHASONE SODIUM PHOSPHATE 10 MG/ML IJ SOLN
INTRAMUSCULAR | Status: DC | PRN
  Administered 2021-05-17: 8 mg via INTRAVENOUS

## 2021-05-17 MED ORDER — ONDANSETRON HCL 4 MG/2ML IJ SOLN
INTRAMUSCULAR | Status: DC | PRN
  Administered 2021-05-17: 4 mg via INTRAVENOUS

## 2021-05-17 MED ORDER — ACETAMINOPHEN 500 MG PO TABS
ORAL_TABLET | ORAL | Status: AC
  Filled 2021-05-17: qty 1

## 2021-05-17 MED ORDER — MIDAZOLAM HCL 2 MG/2ML IJ SOLN
INTRAMUSCULAR | Status: AC
  Filled 2021-05-17: qty 2

## 2021-05-17 MED ORDER — PROPOFOL 500 MG/50ML IV EMUL
INTRAVENOUS | Status: DC | PRN
  Administered 2021-05-17: 100 ug/kg/min via INTRAVENOUS

## 2021-05-17 MED ORDER — SODIUM CHLORIDE (PF) 0.9 % IJ SOLN
INTRAMUSCULAR | Status: DC | PRN
  Administered 2021-05-17: 10 mL

## 2021-05-17 MED ORDER — ONDANSETRON HCL 4 MG/2ML IJ SOLN
INTRAMUSCULAR | Status: AC
  Filled 2021-05-17: qty 2

## 2021-05-17 MED ORDER — METHOCARBAMOL 750 MG PO TABS: 750.0000 mg | ORAL_TABLET | Freq: Three times a day (TID) | ORAL | 0 refills | Status: AC | PRN

## 2021-05-17 MED ORDER — BUPIVACAINE LIPOSOME 1.3 % IJ SUSP
INTRAMUSCULAR | Status: DC | PRN
  Administered 2021-05-17: 10 mL

## 2021-05-17 MED ORDER — FENTANYL CITRATE PF 50 MCG/ML IJ SOSY
PREFILLED_SYRINGE | INTRAMUSCULAR | Status: AC
  Filled 2021-05-17: qty 3

## 2021-05-17 MED ORDER — CHLORHEXIDINE GLUCONATE 0.12 % MT SOLN
15.0000 mL | Freq: Once | OROMUCOSAL | Status: AC
  Administered 2021-05-17: 15 mL via OROMUCOSAL

## 2021-05-17 MED ORDER — WATER FOR IRRIGATION, STERILE IR SOLN
Status: DC | PRN
  Administered 2021-05-17: 2000 mL

## 2021-05-17 MED ORDER — OXYCODONE HCL 5 MG PO TABS
ORAL_TABLET | ORAL | Status: AC
  Filled 2021-05-17: qty 1

## 2021-05-17 SURGICAL SUPPLY — 43 items
BAG COUNTER SPONGE SURGICOUNT (BAG) ×2 IMPLANT
BAG ZIPLOCK 12X15 (MISCELLANEOUS) IMPLANT
BLADE SAG 18X100X1.27 (BLADE) ×2 IMPLANT
BLADE SURG SZ10 CARB STEEL (BLADE) IMPLANT
CHLORAPREP W/TINT 26 (MISCELLANEOUS) ×2 IMPLANT
CLSR STERI-STRIP ANTIMIC 1/2X4 (GAUZE/BANDAGES/DRESSINGS) ×2 IMPLANT
COVER PERINEAL POST (MISCELLANEOUS) ×2 IMPLANT
COVER SURGICAL LIGHT HANDLE (MISCELLANEOUS) ×2 IMPLANT
DECANTER SPIKE VIAL GLASS SM (MISCELLANEOUS) ×4 IMPLANT
DRAPE IMP U-DRAPE 54X76 (DRAPES) ×2 IMPLANT
DRAPE STERI IOBAN 125X83 (DRAPES) ×2 IMPLANT
DRAPE U-SHAPE 47X51 STRL (DRAPES) ×4 IMPLANT
DRSG MEPILEX BORDER 4X8 (GAUZE/BANDAGES/DRESSINGS) ×2 IMPLANT
ELECT REM PT RETURN 15FT ADLT (MISCELLANEOUS) ×2 IMPLANT
GLOVE SRG 8 PF TXTR STRL LF DI (GLOVE) ×1 IMPLANT
GLOVE SURG ENC MOIS LTX SZ7.5 (GLOVE) ×2 IMPLANT
GLOVE SURG POLYISO LF SZ7.5 (GLOVE) ×2 IMPLANT
GLOVE SURG SYN 7.5  E (GLOVE) ×4
GLOVE SURG SYN 7.5 E (GLOVE) ×2 IMPLANT
GLOVE SURG UNDER POLY LF SZ7.5 (GLOVE) ×2 IMPLANT
GLOVE SURG UNDER POLY LF SZ8 (GLOVE) ×2
GOWN STRL REUS W/TWL LRG LVL3 (GOWN DISPOSABLE) ×2 IMPLANT
GOWN STRL REUS W/TWL XL LVL3 (GOWN DISPOSABLE) ×2 IMPLANT
HEAD CERAMIC FEMORAL 36MM (Head) ×2 IMPLANT
HOLDER FOLEY CATH W/STRAP (MISCELLANEOUS) ×2 IMPLANT
INSERT 0 DEG POLY  36 F (Miscellaneous) ×2 IMPLANT
INSERT 0 DEG POLY 36 F (Miscellaneous) ×1 IMPLANT
KIT TURNOVER KIT A (KITS) ×2 IMPLANT
MANIFOLD NEPTUNE II (INSTRUMENTS) ×2 IMPLANT
NS IRRIG 1000ML POUR BTL (IV SOLUTION) ×2 IMPLANT
PACK ANTERIOR HIP CUSTOM (KITS) ×2 IMPLANT
PROTECTOR NERVE ULNAR (MISCELLANEOUS) ×2 IMPLANT
SCREW HEX LP 6.5X20 (Screw) ×2 IMPLANT
SHELL TRIDENT II CLUST SZ 56MM (Shell) ×2 IMPLANT
STEM ACCOLADE SZ 6 (Hips) ×2 IMPLANT
SUT MNCRL AB 3-0 PS2 18 (SUTURE) ×2 IMPLANT
SUT VIC AB 0 CT1 36 (SUTURE) ×2 IMPLANT
SUT VIC AB 1 CT1 36 (SUTURE) ×2 IMPLANT
SUT VIC AB 2-0 CT1 27 (SUTURE) ×4
SUT VIC AB 2-0 CT1 TAPERPNT 27 (SUTURE) ×2 IMPLANT
TRAY FOLEY MTR SLVR 16FR STAT (SET/KITS/TRAYS/PACK) IMPLANT
TUBE SUCTION HIGH CAP CLEAR NV (SUCTIONS) ×2 IMPLANT
WATER STERILE IRR 1000ML POUR (IV SOLUTION) ×4 IMPLANT

## 2021-05-17 NOTE — Anesthesia Procedure Notes (Signed)
Spinal  Patient location during procedure: OR Start time: 05/17/2021 12:30 PM End time: 05/17/2021 12:33 PM Reason for block: surgical anesthesia Staffing Performed: resident/CRNA  Resident/CRNA: Pearson Grippe, CRNA Preanesthetic Checklist Completed: patient identified, IV checked, site marked, risks and benefits discussed, surgical consent, monitors and equipment checked, pre-op evaluation and timeout performed Spinal Block Patient position: sitting Prep: DuraPrep Patient monitoring: heart rate, cardiac monitor, continuous pulse ox and blood pressure Approach: midline Location: L3-4 Injection technique: single-shot Needle Needle type: Sprotte  Needle gauge: 24 G Needle length: 9 cm Assessment Sensory level: T4 Events: CSF return Additional Notes Patient sitting position. Spinal attempt x1. + clear, free-flowing CSF, easy to aspirate/inject. - paresthesia/heme.

## 2021-05-17 NOTE — Interval H&P Note (Signed)
History and Physical Interval Note:  05/17/2021 8:52 AM  Wayne Medina  has presented today for surgery, with the diagnosis of OA RIGHT HIP.  The various methods of treatment have been discussed with the patient and family. After consideration of risks, benefits and other options for treatment, the patient has consented to  Procedure(s): TOTAL HIP ARTHROPLASTY ANTERIOR APPROACH (Right) as a surgical intervention.  The patient's history has been reviewed, patient examined, no change in status, stable for surgery.  I have reviewed the patient's chart and labs.  Questions were answered to the patient's satisfaction.     Sheral Apley

## 2021-05-17 NOTE — Op Note (Signed)
05/17/2021  2:26 PM  PATIENT:  Wayne Medina   MRN: [id number]  PRE-OPERATIVE DIAGNOSIS:  OA RIGHT HIP  POST-OPERATIVE DIAGNOSIS:  OA RIGHT HIP  PROCEDURE:  Procedure(s): TOTAL HIP ARTHROPLASTY ANTERIOR APPROACH  PREOPERATIVE INDICATIONS:    Wayne Medina is an 35 y.o. male who has a diagnosis of AVN (avascular necrosis of bone) ([place]) and elected for surgical management after failing conservative treatment.  The risks benefits and alternatives were discussed with the patient including but not limited to the risks of nonoperative treatment, versus surgical intervention including infection, bleeding, nerve injury, periprosthetic fracture, the need for revision surgery, dislocation, leg length discrepancy, blood clots, cardiopulmonary complications, morbidity, mortality, among others, and they were willing to proceed.     OPERATIVE REPORT     SURGEON:   Renette Butters, MD    ASSISTANT:  Aggie Moats, PA-C, he was present and scrubbed throughout the case, critical for completion in a timely fashion, and for retraction, instrumentation, and closure.     ANESTHESIA:  General    COMPLICATIONS:  None.     COMPONENTS:  Stryker acolade fit femur size 6 with a 36 mm -0 head ball and an acetabular shell size 56 with a  polyethylene liner    PROCEDURE IN DETAIL:   The patient was met in the holding area and  identified.  The appropriate hip was identified and marked at the operative site.  The patient was then transported to the OR  and  placed under anesthesia per that record.  At that point, the patient was  placed in the supine position and  secured to the operating room table and all bony prominences padded. He received pre-operative antibiotics    The operative lower extremity was prepped from the iliac crest to the distal leg.  Sterile draping was performed.  Time out was performed prior to incision.      Skin incision was made just 2 cm lateral to the ASIS  extending in line  with the tensor fascia lata. Electrocautery was used to control all bleeders. I dissected down sharply to the fascia of the tensor fascia lata was confirmed that the muscle fibers beneath were running posteriorly. I then incised the fascia over the superficial tensor fascia lata in line with the incision. The fascia was elevated off the anterior aspect of the muscle the muscle was retracted posteriorly and protected throughout the case. I then used electrocautery to incise the tensor fascia lata fascia control and all bleeders. Immediately visible was the fat over top of the anterior neck and capsule.  I removed the anterior fat from the capsule and elevated the rectus muscle off of the anterior capsule. I then removed a large time of capsule. The retractors were then placed over the anterior acetabulum as well as around the superior and inferior neck.  I then made a femoral neck cut. Then used the power corkscrew to remove the femoral head from the acetabulum and thoroughly irrigated the acetabulum. I sized the femoral head.    I then exposed the deep acetabulum, cleared out any tissue including the ligamentum teres.   After adequate visualization, I excised the labrum, and then sequentially reamed.  I then impacted the acetabular implant into place using fluoroscopy for guidance.  Appropriate version and inclination was confirmed clinically matching their bony anatomy, and with fluoroscopy.  I placed a 20 mm screw in the posterior/superio position with an excellent bite.    I then placed the polyethylene  liner in place  I then adducted the leg and released the external rotators from the posterior femur allowing it to be easily delivered up lateral and anterior to the acetabulum for preparation of the femoral canal.    I then prepared the proximal femur using the cookie-cutter and then sequentially reamed and broached.  A trial broach, neck, and head was utilized, and I reduced the hip and used  floroscopy to assess the neck length and femoral implant.  I then impacted the femoral prosthesis into place into the appropriate version. The hip was then reduced and fluoroscopy confirmed appropriate position. Leg lengths were restored.  I then irrigated the hip copiously again with, and repaired the fascia with Vicryl, followed by monocryl for the subcutaneous tissue, Monocryl for the skin, Steri-Strips and sterile gauze. The patient was then awakened and returned to PACU in stable and satisfactory condition. There were no complications.  POST OPERATIVE PLAN: WBAT, DVT px: SCD's/TED, ambulation and chemical dvt px  Edmonia Lynch, MD Orthopedic Surgeon [phone]

## 2021-05-17 NOTE — Anesthesia Postprocedure Evaluation (Signed)
Anesthesia Post Note  Patient: Wayne Medina  Procedure(s) Performed: TOTAL HIP ARTHROPLASTY ANTERIOR APPROACH (Right: Hip)     Patient location during evaluation: PACU Anesthesia Type: Spinal Level of consciousness: awake Pain management: pain level controlled Vital Signs Assessment: post-procedure vital signs reviewed and stable Respiratory status: spontaneous breathing Cardiovascular status: stable Postop Assessment: no apparent nausea or vomiting Anesthetic complications: no   No notable events documented.  Last Vitals:  Vitals:   05/17/21 1500 05/17/21 1515  BP: 109/65 116/79  Pulse: 70 71  Resp: 13 13  Temp:    SpO2: 100% 100%    Last Pain:  Vitals:   05/17/21 1515  TempSrc:   PainSc: 3     LLE Motor Response: Purposeful movement;Responds to commands (05/17/21 1515) LLE Sensation: Numbness;Tingling (05/17/21 1515) RLE Motor Response: Purposeful movement;Responds to commands (05/17/21 1515) RLE Sensation: Numbness;Tingling (05/17/21 1515) L Sensory Level: S1-Sole of foot, small toes (05/17/21 1515) R Sensory Level: S1-Sole of foot, small toes (05/17/21 1515)  Earnie Bechard

## 2021-05-17 NOTE — Anesthesia Preprocedure Evaluation (Signed)
Anesthesia Evaluation  Patient identified by MRN, date of birth, ID band Patient awake    Reviewed: Allergy & Precautions, NPO status , Patient's Chart, lab work & pertinent test results  Airway Mallampati: II  TM Distance: >3 FB     Dental   Pulmonary Current Smoker and Patient abstained from smoking.,    breath sounds clear to auscultation       Cardiovascular negative cardio ROS   Rhythm:Regular Rate:Normal     Neuro/Psych    GI/Hepatic Neg liver ROS, GERD  ,  Endo/Other  negative endocrine ROS  Renal/GU negative Renal ROS     Musculoskeletal   Abdominal   Peds  Hematology   Anesthesia Other Findings   Reproductive/Obstetrics                             Anesthesia Physical Anesthesia Plan  ASA: 2  Anesthesia Plan: Spinal   Post-op Pain Management:    Induction: Intravenous  PONV Risk Score and Plan: Ondansetron, Propofol infusion and Midazolam  Airway Management Planned: Nasal Cannula and Simple Face Mask  Additional Equipment:   Intra-op Plan:   Post-operative Plan:   Informed Consent: I have reviewed the patients History and Physical, chart, labs and discussed the procedure including the risks, benefits and alternatives for the proposed anesthesia with the patient or authorized representative who has indicated his/her understanding and acceptance.     Dental advisory given  Plan Discussed with: CRNA and Anesthesiologist  Anesthesia Plan Comments:         Anesthesia Quick Evaluation

## 2021-05-17 NOTE — Discharge Instructions (Addendum)

## 2021-05-17 NOTE — Evaluation (Signed)
Physical Therapy Evaluation Patient Details Name: Wayne Medina MRN: [id number] DOB: 01-22-1986 Today's Date: 05/17/2021  History of Present Illness  Patient is 35 y.o. male s/p Rt THA anterior approach on 05/17/21 with PMH significant for ADHD, GERD, AVN of bil hips, Lt THA anterior approach on 02/17/20.   Clinical Impression  Wayne Medina is a 35 y.o. male POD 0 s/p Rt THA. Patient reports independence with mobility at baseline. Patient is now limited by functional impairments (see PT problem list below) and requires min guard/supervision for transfers and gait with RW. Patient was able to ambulate ~200 feet with RW and supervision and cues for safe walker management. Patient educated on safe sequencing for stair mobility and verbalized safe guarding position for people assisting with mobility. Patient instructed in exercises to facilitate ROM and circulation. Patient will benefit from continued skilled PT interventions to address impairments and progress towards PLOF. Patient has met mobility goals at adequate level for discharge home; will continue to follow if pt continues acute stay to progress towards Mod I goals.      Recommendations for follow up therapy are one component of a multi-disciplinary discharge planning process, led by the attending physician.  Recommendations may be updated based on patient status, additional functional criteria and insurance authorization.  Follow Up Recommendations Outpatient PT    Assistance Recommended at Discharge Intermittent Supervision/Assistance  Functional Status Assessment Patient has had a recent decline in their functional status and demonstrates the ability to make significant improvements in function in a reasonable and predictable amount of time.  Equipment Recommendations  None recommended by PT    Recommendations for Other Services       Precautions / Restrictions Precautions Precautions: Fall Restrictions Weight Bearing  Restrictions: No Other Position/Activity Restrictions: WBAT      Mobility  Bed Mobility Overal bed mobility: Needs Assistance Bed Mobility: Supine to Sit     Supine to sit: Min guard;Supervision     General bed mobility comments: pt taking extra time, using belt to assist Rt LE on/off EOB    Transfers Overall transfer level: Needs assistance Equipment used: Rolling walker (2 wheels) Transfers: Sit to/from Stand Sit to Stand: Supervision           General transfer comment: sup for safety, cues for hand placement with power up.    Ambulation/Gait Ambulation/Gait assistance: Supervision Gait Distance (Feet): 200 Feet Assistive device: Rolling walker (2 wheels) Gait Pattern/deviations: Step-to pattern;Step-through pattern;Decreased stride length;Decreased weight shift to right Gait velocity: decr     General Gait Details: cues for step pattern and proixity to RW, no LOB noted. pt with flexed posture due to pain.  Stairs            Wheelchair Mobility    Modified Rankin (Stroke Patients Only)       Balance Overall balance assessment: Needs assistance Sitting-balance support: Feet supported Sitting balance-Leahy Scale: Good     Standing balance support: Reliant on assistive device for balance;During functional activity;Bilateral upper extremity supported Standing balance-Leahy Scale: Fair                               Pertinent Vitals/Pain Pain Assessment: 0-10 Pain Score: 10-Worst pain ever Pain Location: Rt hip Pain Descriptors / Indicators: Aching;Discomfort Pain Intervention(s): Limited activity within patient's tolerance;Monitored during session;Repositioned;Ice applied    Home Living Family/patient expects to be discharged to:: Private residence Living Arrangements: Alone Available Help at Discharge:  Family Type of Home: House Home Access: Stairs to enter Entrance Stairs-Rails:  (Rt at [place] as pt's) Technical brewer  of Steps: 3 at his house, 2 steps at [place]: One level [place]: Conservation officer, nature (2 wheels);Crutches Additional Comments: dating and OT who can help him as he recovers and is going to his fathers initially    Prior Function Prior Level of Function : Independent/Modified Independent             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 [place] Hand: Right    Extremity/Trunk Assessment   Upper Extremity Assessment Upper Extremity Assessment: Overall WFL for tasks assessed    Lower Extremity Assessment Lower Extremity Assessment: Overall WFL for tasks assessed (limited by pain)    Cervical / Trunk Assessment Cervical / Trunk Assessment: Normal  Communication   Communication: No difficulties  Cognition Arousal/Alertness: Awake/alert Behavior During Therapy: WFL for tasks assessed/performed Overall Cognitive Status: Within Functional Limits for tasks assessed                                          General Comments      Exercises     Assessment/Plan    PT Assessment Patient needs continued PT services  PT Problem List Decreased strength;Decreased range of motion;Decreased activity tolerance;Decreased mobility;Decreased balance;Decreased knowledge of use of DME;Decreased knowledge of precautions;Pain       PT Treatment Interventions DME instruction;Gait training;Stair training;Functional mobility training;Therapeutic activities;Therapeutic exercise;Balance training;Patient/family education    PT Goals (Current goals can be found in the Care Plan section)  Acute Rehab PT Goals Patient Stated Goal: get home tonight PT Goal Formulation: With patient Time For Goal Achievement: 05/24/21 Potential to Achieve Goals: Good    Frequency 7X/week   Barriers to discharge        Co-evaluation               AM-PAC PT "6 Clicks" Mobility  Outcome Measure Help needed turning from your back to  your side while in a flat bed without using bedrails?: A Little Help needed moving from lying on your back to sitting on the side of a flat bed without using bedrails?: A Little Help needed moving to and from a bed to a chair (including a wheelchair)?: A Little Help needed standing up from a chair using your arms (e.g., wheelchair or bedside chair)?: A Little Help needed to walk in hospital room?: A Little Help needed climbing 3-5 steps with a railing? : A Little 6 Click Score: 18    End of Session Equipment Utilized During Treatment: Gait belt Activity Tolerance: Patient tolerated treatment well Patient left: in bed;with call bell/phone within reach Nurse Communication: Mobility status PT Visit Diagnosis: Muscle weakness (generalized) (M62.81);Difficulty in walking, not elsewhere classified (R26.2);Pain Pain - Right/Left: Right Pain - part of body: Hip    Time: 7001-7494 PT Time Calculation (min) (ACUTE ONLY): 31 min   Charges:   PT Evaluation $PT Eval Low Complexity: 1 Low PT Treatments $Gait Training: 8-22 mins        Verner Mould, DPT Acute Rehabilitation Services Office [phone] Pager [phone]   Jacques Navy 05/17/2021, 6:23 PM

## 2021-05-17 NOTE — Transfer of Care (Signed)
Immediate Anesthesia Transfer of Care Note  Patient: LEVERT HESLOP  Procedure(s) Performed: TOTAL HIP ARTHROPLASTY ANTERIOR APPROACH (Right: Hip)  Patient Location: PACU  Anesthesia Type:Spinal  Level of Consciousness: awake, alert  and oriented  Airway & Oxygen Therapy: Patient Spontanous Breathing  Post-op Assessment: Report given to RN and Post -op Vital signs reviewed and stable  Post vital signs: Reviewed and stable  Last Vitals:  Vitals Value Taken Time  BP 108/63 05/17/21 1448  Temp    Pulse 63 05/17/21 1449  Resp 12 05/17/21 1450  SpO2 100 % 05/17/21 1449  Vitals shown include unvalidated device data.  Last Pain:  Vitals:   05/17/21 0953  TempSrc:   PainSc: 6       Patients Stated Pain Goal: 4 (05/17/21 0953)  Complications: No notable events documented.

## 2021-05-18 ENCOUNTER — Encounter (HOSPITAL_COMMUNITY): Payer: Self-pay | Admitting: Orthopedic Surgery

## 2021-05-23 ENCOUNTER — Encounter (HOSPITAL_COMMUNITY): Payer: 59

## 2022-06-02 IMAGING — XA DG HIP (WITH OR WITHOUT PELVIS) 1V*R*
1 series · 2 of 2 positions shown · non-contrast
Comparison: 02/17/2020

CLINICAL DATA: Right total hip replacement

EXAM:
DG HIP (WITH OR WITHOUT PELVIS) 1V RIGHT

[Series 1: unknown protocol · 2 of 2 slices shown]
[im 1/2]
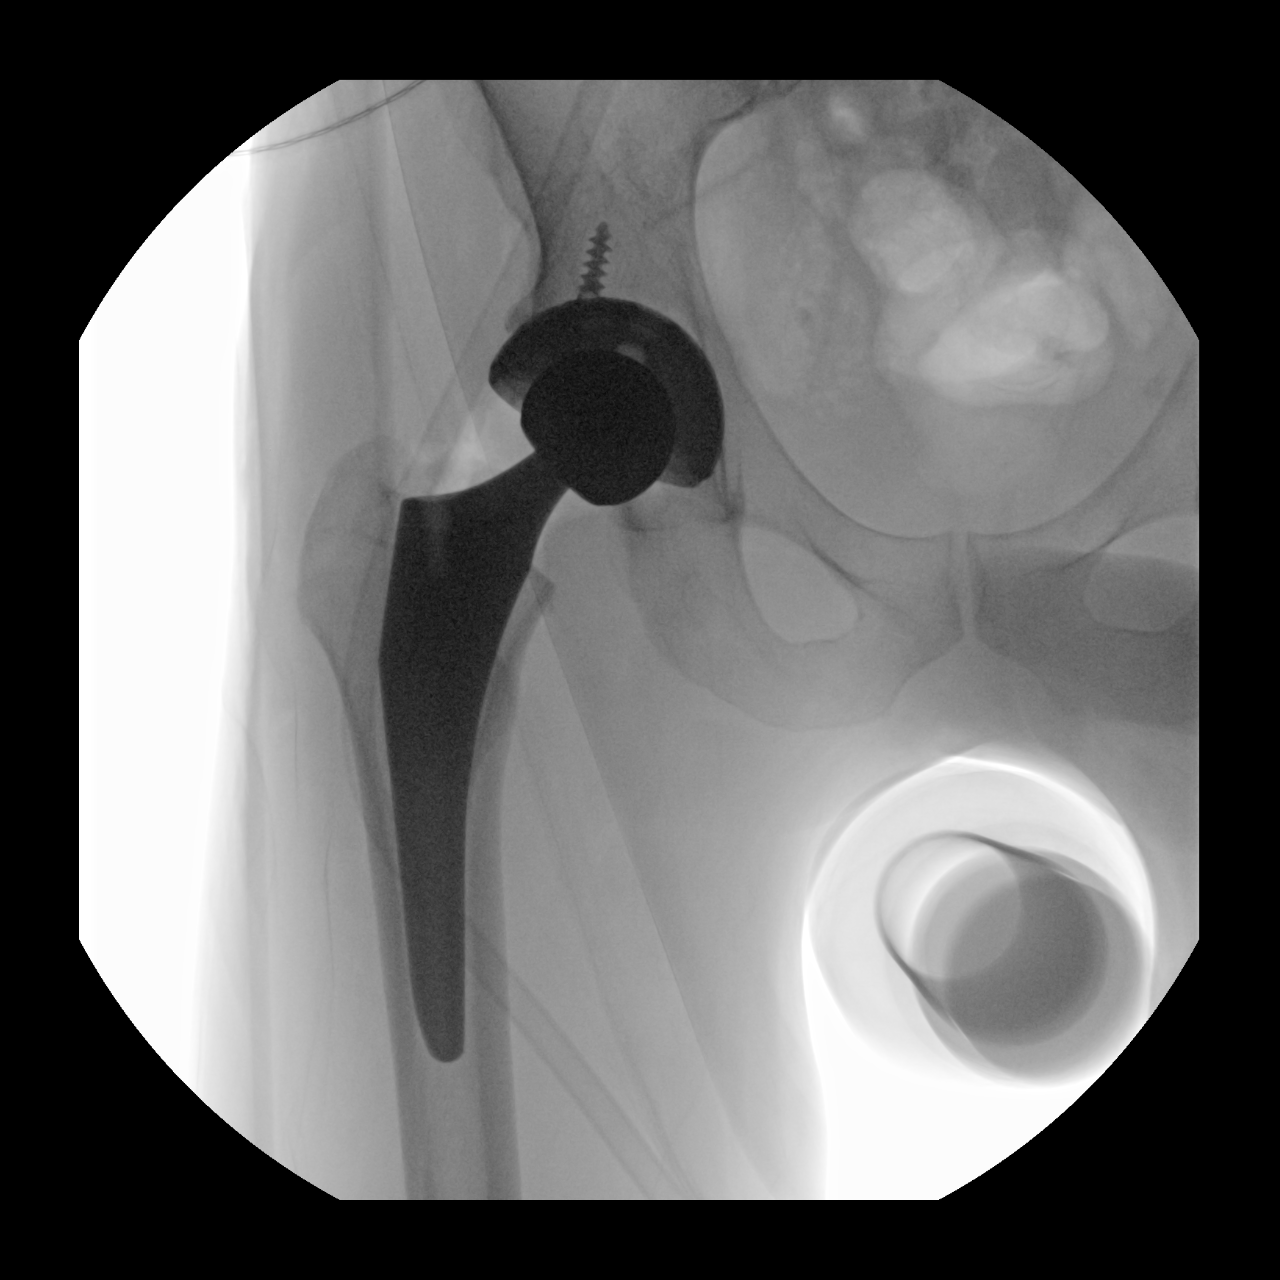
[im 2/2]
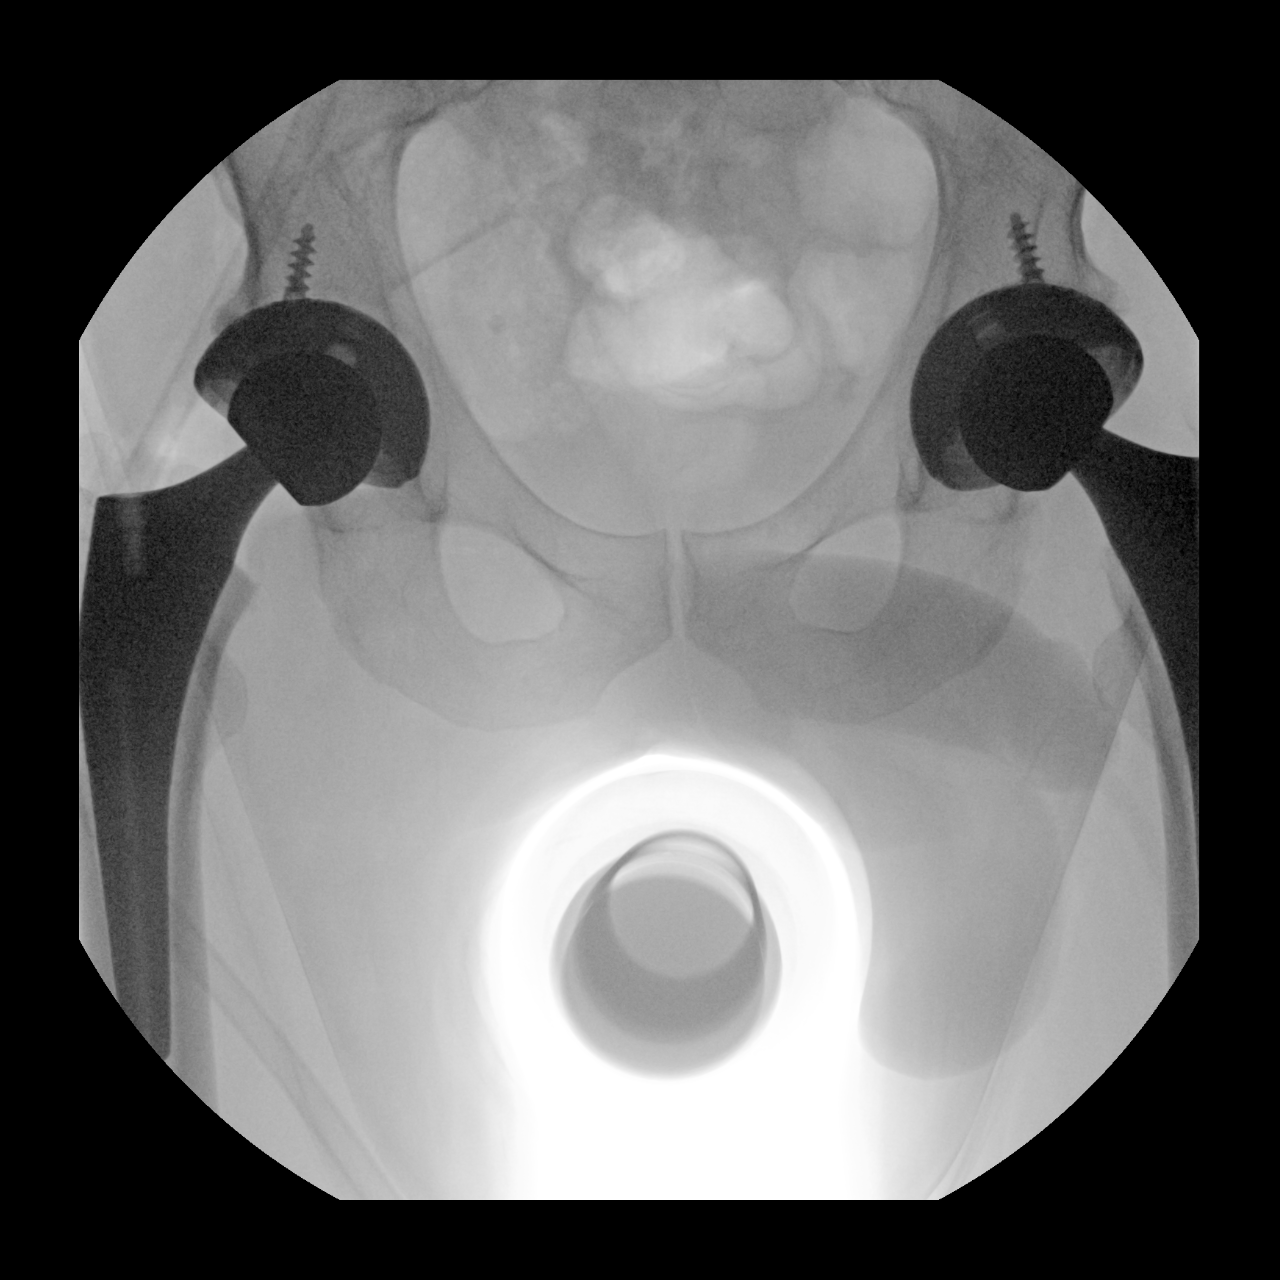

[2 of 2 positions shown; findings below may reference images not displayed]

FINDINGS: C-arm images show total hip replacement on the right. Components
appear well positioned. No radiographically detectable complication.
IMPRESSION: Good appearance following total hip replacement on the right.
